# Patient Record
Sex: Female | Born: 1960 | Race: Black or African American | Hispanic: No | Marital: Married | Smoking: Never smoker
Health system: Southern US, Community
[De-identification: ages and names within clinical notes are randomized; demographics above are authoritative.]

## PROBLEM LIST (undated history)

## (undated) DIAGNOSIS — R42 Dizziness and giddiness: Secondary | ICD-10-CM

## (undated) DIAGNOSIS — E538 Deficiency of other specified B group vitamins: Secondary | ICD-10-CM

## (undated) DIAGNOSIS — K219 Gastro-esophageal reflux disease without esophagitis: Secondary | ICD-10-CM

## (undated) DIAGNOSIS — D649 Anemia, unspecified: Secondary | ICD-10-CM

## (undated) HISTORY — DX: Anemia, unspecified: D64.9

## (undated) HISTORY — DX: Gastro-esophageal reflux disease without esophagitis: K21.9

## (undated) HISTORY — DX: Deficiency of other specified B group vitamins: E53.8

## (undated) HISTORY — PX: OVARIAN CYST REMOVAL: SHX89

## (undated) HISTORY — PX: ABDOMINAL HYSTERECTOMY: SHX81

## (undated) HISTORY — PX: TUBAL LIGATION: SHX77

## (undated) HISTORY — DX: Dizziness and giddiness: R42

---

## 1978-07-13 HISTORY — PX: BREAST EXCISIONAL BIOPSY: SUR124

## 1990-07-13 HISTORY — PX: BREAST EXCISIONAL BIOPSY: SUR124

## 1998-07-25 ENCOUNTER — Ambulatory Visit (HOSPITAL_COMMUNITY): Admission: RE | Admit: 1998-07-25 | Discharge: 1998-07-25 | Payer: Self-pay | Admitting: Obstetrics and Gynecology

## 1998-12-25 ENCOUNTER — Other Ambulatory Visit: Admission: RE | Admit: 1998-12-25 | Discharge: 1998-12-25 | Payer: Self-pay | Admitting: Obstetrics and Gynecology

## 1999-01-07 ENCOUNTER — Ambulatory Visit (HOSPITAL_COMMUNITY): Admission: RE | Admit: 1999-01-07 | Discharge: 1999-01-07 | Payer: Self-pay | Admitting: Obstetrics and Gynecology

## 1999-01-07 ENCOUNTER — Encounter: Payer: Self-pay | Admitting: Obstetrics and Gynecology

## 1999-06-10 ENCOUNTER — Encounter (INDEPENDENT_AMBULATORY_CARE_PROVIDER_SITE_OTHER): Payer: Self-pay | Admitting: Specialist

## 1999-06-10 ENCOUNTER — Other Ambulatory Visit: Admission: RE | Admit: 1999-06-10 | Discharge: 1999-06-10 | Payer: Self-pay | Admitting: Obstetrics and Gynecology

## 1999-10-03 ENCOUNTER — Encounter (INDEPENDENT_AMBULATORY_CARE_PROVIDER_SITE_OTHER): Payer: Self-pay

## 1999-10-03 ENCOUNTER — Observation Stay (HOSPITAL_COMMUNITY): Admission: RE | Admit: 1999-10-03 | Discharge: 1999-10-04 | Payer: Self-pay | Admitting: Obstetrics and Gynecology

## 2000-08-10 ENCOUNTER — Other Ambulatory Visit: Admission: RE | Admit: 2000-08-10 | Discharge: 2000-08-10 | Payer: Self-pay | Admitting: Otolaryngology

## 2001-01-06 ENCOUNTER — Ambulatory Visit (HOSPITAL_COMMUNITY): Admission: RE | Admit: 2001-01-06 | Discharge: 2001-01-06 | Payer: Self-pay | Admitting: Obstetrics and Gynecology

## 2001-01-06 ENCOUNTER — Encounter: Payer: Self-pay | Admitting: Obstetrics and Gynecology

## 2001-03-22 ENCOUNTER — Other Ambulatory Visit: Admission: RE | Admit: 2001-03-22 | Discharge: 2001-03-22 | Payer: Self-pay | Admitting: Obstetrics and Gynecology

## 2001-09-13 ENCOUNTER — Emergency Department (HOSPITAL_COMMUNITY): Admission: EM | Admit: 2001-09-13 | Discharge: 2001-09-14 | Payer: Self-pay | Admitting: Emergency Medicine

## 2001-11-17 ENCOUNTER — Encounter (INDEPENDENT_AMBULATORY_CARE_PROVIDER_SITE_OTHER): Payer: Self-pay | Admitting: Specialist

## 2001-11-17 ENCOUNTER — Ambulatory Visit (HOSPITAL_COMMUNITY): Admission: RE | Admit: 2001-11-17 | Discharge: 2001-11-17 | Payer: Self-pay | Admitting: *Deleted

## 2002-01-25 ENCOUNTER — Ambulatory Visit (HOSPITAL_COMMUNITY): Admission: RE | Admit: 2002-01-25 | Discharge: 2002-01-25 | Payer: Self-pay | Admitting: Obstetrics and Gynecology

## 2002-01-25 ENCOUNTER — Encounter (INDEPENDENT_AMBULATORY_CARE_PROVIDER_SITE_OTHER): Payer: Self-pay | Admitting: *Deleted

## 2002-01-25 ENCOUNTER — Observation Stay (HOSPITAL_COMMUNITY): Admission: AD | Admit: 2002-01-25 | Discharge: 2002-01-26 | Payer: Self-pay | Admitting: Obstetrics and Gynecology

## 2002-02-14 ENCOUNTER — Ambulatory Visit (HOSPITAL_COMMUNITY): Admission: RE | Admit: 2002-02-14 | Discharge: 2002-02-14 | Payer: Self-pay | Admitting: Obstetrics and Gynecology

## 2002-02-14 ENCOUNTER — Encounter: Payer: Self-pay | Admitting: Obstetrics and Gynecology

## 2002-02-15 ENCOUNTER — Encounter: Payer: Self-pay | Admitting: Obstetrics and Gynecology

## 2002-02-15 ENCOUNTER — Ambulatory Visit (HOSPITAL_COMMUNITY): Admission: RE | Admit: 2002-02-15 | Discharge: 2002-02-15 | Payer: Self-pay | Admitting: Obstetrics and Gynecology

## 2002-02-21 ENCOUNTER — Ambulatory Visit (HOSPITAL_COMMUNITY): Admission: RE | Admit: 2002-02-21 | Discharge: 2002-02-21 | Payer: Self-pay | Admitting: Obstetrics and Gynecology

## 2002-02-21 ENCOUNTER — Encounter: Payer: Self-pay | Admitting: Obstetrics and Gynecology

## 2003-08-02 ENCOUNTER — Other Ambulatory Visit: Admission: RE | Admit: 2003-08-02 | Discharge: 2003-08-02 | Payer: Self-pay | Admitting: Obstetrics and Gynecology

## 2003-08-07 ENCOUNTER — Ambulatory Visit (HOSPITAL_COMMUNITY): Admission: RE | Admit: 2003-08-07 | Discharge: 2003-08-07 | Payer: Self-pay | Admitting: Obstetrics and Gynecology

## 2004-06-02 ENCOUNTER — Inpatient Hospital Stay (HOSPITAL_COMMUNITY): Admission: EM | Admit: 2004-06-02 | Discharge: 2004-06-04 | Payer: Self-pay | Admitting: Emergency Medicine

## 2004-06-02 ENCOUNTER — Ambulatory Visit: Payer: Self-pay | Admitting: Cardiology

## 2004-06-03 ENCOUNTER — Ambulatory Visit: Payer: Self-pay | Admitting: Cardiology

## 2004-06-26 ENCOUNTER — Ambulatory Visit: Payer: Self-pay

## 2004-10-02 ENCOUNTER — Other Ambulatory Visit: Admission: RE | Admit: 2004-10-02 | Discharge: 2004-10-02 | Payer: Self-pay | Admitting: Obstetrics and Gynecology

## 2004-10-07 ENCOUNTER — Ambulatory Visit (HOSPITAL_COMMUNITY): Admission: RE | Admit: 2004-10-07 | Discharge: 2004-10-07 | Payer: Self-pay | Admitting: Obstetrics and Gynecology

## 2005-07-21 ENCOUNTER — Emergency Department (HOSPITAL_COMMUNITY): Admission: EM | Admit: 2005-07-21 | Discharge: 2005-07-21 | Payer: Self-pay | Admitting: Emergency Medicine

## 2005-07-22 ENCOUNTER — Emergency Department (HOSPITAL_COMMUNITY): Admission: EM | Admit: 2005-07-22 | Discharge: 2005-07-22 | Payer: Self-pay | Admitting: Emergency Medicine

## 2005-10-07 ENCOUNTER — Other Ambulatory Visit: Admission: RE | Admit: 2005-10-07 | Discharge: 2005-10-07 | Payer: Self-pay | Admitting: Obstetrics and Gynecology

## 2005-10-13 ENCOUNTER — Ambulatory Visit (HOSPITAL_COMMUNITY): Admission: RE | Admit: 2005-10-13 | Discharge: 2005-10-13 | Payer: Self-pay | Admitting: Obstetrics and Gynecology

## 2007-06-24 ENCOUNTER — Ambulatory Visit: Payer: Self-pay | Admitting: Internal Medicine

## 2007-07-11 LAB — CBC WITH DIFFERENTIAL/PLATELET
BASO%: 0.4 % (ref 0.0–2.0)
Basophils Absolute: 0 10*3/uL (ref 0.0–0.1)
EOS%: 0.7 % (ref 0.0–7.0)
Eosinophils Absolute: 0 10*3/uL (ref 0.0–0.5)
HCT: 42.4 % (ref 34.8–46.6)
HGB: 14.6 g/dL (ref 11.6–15.9)
LYMPH%: 22.7 % (ref 14.0–48.0)
MCH: 30.8 pg (ref 26.0–34.0)
MCHC: 34.3 g/dL (ref 32.0–36.0)
MCV: 89.8 fL (ref 81.0–101.0)
MONO#: 0.2 10*3/uL (ref 0.1–0.9)
MONO%: 2.9 % (ref 0.0–13.0)
NEUT#: 4.4 10*3/uL (ref 1.5–6.5)
NEUT%: 73.3 % (ref 39.6–76.8)
Platelets: 267 10*3/uL (ref 145–400)
RBC: 4.72 10*6/uL (ref 3.70–5.32)
RDW: 12 % (ref 11.3–14.5)
WBC: 6.1 10*3/uL (ref 3.9–10.0)
lymph#: 1.4 10*3/uL (ref 0.9–3.3)

## 2007-07-11 LAB — COMPREHENSIVE METABOLIC PANEL
ALT: 12 U/L (ref 0–35)
AST: 19 U/L (ref 0–37)
Albumin: 4.3 g/dL (ref 3.5–5.2)
Alkaline Phosphatase: 72 U/L (ref 39–117)
BUN: 13 mg/dL (ref 6–23)
CO2: 25 mEq/L (ref 19–32)
Calcium: 9.4 mg/dL (ref 8.4–10.5)
Chloride: 104 mEq/L (ref 96–112)
Creatinine, Ser: 0.89 mg/dL (ref 0.40–1.20)
Glucose, Bld: 85 mg/dL (ref 70–99)
Potassium: 4 mEq/L (ref 3.5–5.3)
Sodium: 142 mEq/L (ref 135–145)
Total Bilirubin: 0.6 mg/dL (ref 0.3–1.2)
Total Protein: 7.9 g/dL (ref 6.0–8.3)

## 2007-07-11 LAB — IRON AND TIBC
%SAT: 43 % (ref 20–55)
Iron: 136 ug/dL (ref 42–145)
TIBC: 313 ug/dL (ref 250–470)
UIBC: 177 ug/dL

## 2007-07-11 LAB — VITAMIN B12: Vitamin B-12: 429 pg/mL (ref 211–911)

## 2007-07-11 LAB — FERRITIN: Ferritin: 26 ng/mL (ref 10–291)

## 2007-07-11 LAB — LACTATE DEHYDROGENASE: LDH: 172 U/L (ref 94–250)

## 2007-07-11 LAB — FOLATE: Folate: >20 ng/mL

## 2008-12-12 ENCOUNTER — Encounter: Admission: RE | Admit: 2008-12-12 | Discharge: 2008-12-12 | Payer: Self-pay | Admitting: Obstetrics and Gynecology

## 2010-02-17 ENCOUNTER — Ambulatory Visit (HOSPITAL_COMMUNITY): Admission: RE | Admit: 2010-02-17 | Discharge: 2010-02-17 | Payer: Self-pay | Admitting: Obstetrics and Gynecology

## 2010-11-28 NOTE — Op Note (Signed)
[facility]  Patient:    Katie Hendrix, Katie Hendrix                       MRN: [id number] Proc. Date: 10/03/99 Adm. Date:  [id number] Attending:  Leonard Schwartz                           Operative Report  PREOPERATIVE DIAGNOSIS:       Fibroid uterus.  Dysmenorrhea.  Menorrhagia. Anemia (hemoglobin is 10.8).  POSTOPERATIVE DIAGNOSIS:      Fibroid uterus.  Dysmenorrhea.  Menorrhagia. Anemia (hemoglobin is 10.8).  OPERATION:                    Vaginal hysterectomy.  SURGEON:                      Janine Limbo, M.D.  ASSISTANT:                    Henreitta Leber, P.A.  ANESTHESIA:                   General anesthesia.  ESTIMATED BLOOD LOSS:  INDICATIONS:                  [name] is a 50 year old female, para 2-0-0-2, who presents with the above mentioned diagnosis.  The patient understands the indications for her procedure and she accepts the risks of, but not limited to,  anesthetic complications, bleeding, infections, and possible damage to the surrounding organs.  FINDINGS:                     The patient had a 12 to 14 week size irregular fibroid uterus.  There were simple cysts present on both ovaries.  The fallopian tubes appeared normal.  DESCRIPTION OF PROCEDURE:     The patient was taken to the operating room where a general anesthesia was given.  The patients abdomen, perineum, and vagina were prepped with multiple layers of Betadine.  A Foley catheter was placed in the bladder.  The patient was sterilely draped.  A weighted speculum was placed in he posterior vagina.  The cervix was injected with a diluted solution of Pitressin and normal saline.  A circumferential incision was made around the cervix and the vaginal mucosa was advanced both anteriorly and posteriorly.  Alternating from right to left, the uterosacral ligaments, paracervical tissues, parametrial tissues, uterine arteries, and upper pedicles were clamped,  cut, sutured, and tied securely.  The uterus was inverted through the posterior colpotomy.  The uterus was transected from the remaining upper tissues.  The specimen was sent to pathology. The upper pedicles were secured using free ties and suture ligatures.  The figure-of-eight sutures were required for hemostasis.  The sutures attached to he uterosacral ligaments were brought out through the vaginal angle and then tied securely.  A McCall culdoplasty was placed in the posterior cul-de-sac incorporating the uterosacral ligaments and the posterior peritoneum.  A final check was made for hemostasis and again hemostasis was confirmed.  All instruments were removed.  The vaginal cuff was closed using figure-of-eight sutures incorporating the anterior vaginal mucosa, the anterior peritoneum, the posterior peritoneum, and the posterior vaginal mucosa.  Hemostasis was adequate.  The McCall culdoplasty suture was tied and the apex of the vagina was noted to elevate into the midpelvis.  0 Vicryl was  the suture material used throughout the procedure.  Sponge, needle, and instrument counts were correct x 2 occasions.  The estimated blood loss was 100 cc.  The patient tolerated her procedure well.  She was noted to drain clear yellow urine at the end of her procedure.  She was awakened from her anesthetic and taken to the recovery room in stable condition. DD:  10/03/99 TD:  10/03/99 Job: [id number]

## 2010-11-28 NOTE — H&P (Signed)
NAME:  Katie Hendrix, Katie Hendrix NO.:  [id number]   MEDICAL RECORD NO.:  [id number]          PATIENT TYPE:  EMS   LOCATION:  MAJO                         FACILITY:  [facility]   PHYSICIAN:  Jonelle Sidle, M.D. LHCDATE OF BIRTH:  1961-04-03   DATE OF ADMISSION:  06/02/2004  DATE OF DISCHARGE:                                HISTORY & PHYSICAL   GYNECOLOGIST:  Janine Limbo, M.D.   GASTROENTEROLOGIST:  Althea Grimmer. Luther Parody, M.D.   CHIEF COMPLAINT:  Chest discomfort.   HISTORY OF PRESENT ILLNESS:  Ms. Katie Hendrix is a very pleasant 50 year old woman  with no history of type 2 diabetes mellitus, hypertension, dyslipidemia or  coronary artery disease.  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  She has been jogging and has noted over the last  few weeks at the end of her run that she has had a tightness in her chest  that typically last for 10-15 minutes.  This was fairly mild and did not  seem to bother her very much, although she has had progressive symptoms.  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  She did note that the symptoms were worse after  eating lunch today, but reports they were somewhat better after being placed  on nitroglycerin paste in the emergency room.  She does not describe  specific chest tightness while she was running more at the end of the run  and today it was actually mostly at rest.  She has not had any prior cardiac  evaluation, except that she states of a chest pain evaluation approximately  three years ago, although I am not certain that any stress testing or other  diagnostic testing was actually performed at that time based on available  information.  At present, her electrocardiogram shows sinus rhythm and she  has T-wave inversions anteriorly that are new compared to previous tracing  in March of 2003.  A repeat tracing is pending at this time.  Initial point  of care cardiac markers are normal.   ALLERGIES:  No known drug allergies.   CURRENT MEDICATIONS:  B12 injections monthly.   PAST MEDICAL HISTORY:  1.  Pernicious anemia on B12 supplementation.  2.  Status post left breast biopsy.  3.  Status post ovarian cyst removal on two occasions.  4.  Status post cesarean section on two occasions.   No documented history of coronary artery disease, myocardial infarction,  dyslipidemia, type 2 diabetes mellitus, hypertension or thyroid disease.   SOCIAL HISTORY:  The patient is married.  [place]  She has three children.  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.  She denies any tobacco or illicit substance  use.   FAMILY HISTORY:  Significant for coronary artery disease and bypass grafting  in the patient's mother in her 78s.  REVIEW OF SYSTEMS:  As described in the history of present illness.  She has  otherwise had some mild palpitations, but no syncope.   PHYSICAL EXAMINATION:  VITAL SIGNS:  The blood pressure is 103/67, heart  rate 64, temperature 97.3 degrees, respirations 16 and oxygen saturation  100% on room air.  GENERAL APPEARANCE:  This is a well-nourished woman in no acute distress.  HEENT:  Conjunctivae and lids normal.  The oropharynx is clear.  NECK:  Supple without elevated jugular venous pressure or loud carotid  bruits.  No thyromegaly is noted.  LUNGS:  Clear to auscultation bilaterally with no wheezing or labored  breathing at rest.  CARDIAC:  Regular rate and rhythm without pericardial rub, loud murmur or S3  gallop.  ABDOMEN:  Soft and nontender with normoactive bowel sounds.  EXTREMITIES:  No pitting edema.  Peripheral pulses are 2+.  SKIN:  No ulcerative changes are noted.  MUSCULOSKELETAL:  No kyphosis  is noted.  NEUROPSYCHIATRIC:  The patient is alert and oriented x 3.   LABORATORY DATA:  Hemoglobin 13.9, hematocrit 41.  Sodium 140, potassium  4.7, BUN 16, creatinine 1.1, glucose 98.  Initial point of care troponin I  levels and CK-MB levels were normal x 2.   Chest x-ray reported as showing no acute disease process.   IMPRESSION:  1.  Chest pain syndrome as outlined in a 50 year old woman with a history of      coronary artery disease in her mother at age 17, but no other major      cardiac risk factors.  Electrocardiogram is abnormal showing anterior T-      wave inversions that are new compared to a tracing from 2003.  Cardiac      markers were negative initially.  2.  Possible history of gastrointestinal reflux.  3.  History of pernicious anemia.   PLAN:  1.  The patient will be admitted to telemetry.  2.  We will continue to cycle cardiac markers.  3.  Will place her on aspirin, Lovenox, nitroglycerin paste and low-dose      beta blocker.  4.  Will also treat with a proton pump inhibitor.  5.  I discussed the situation with the patient and her husband, including      available noninvasive and invasive tests to investigate the possibility      of coronary artery disease.  Obviously cardiac markers will help in this      decision.  However, my feeling is that we ought to consider definitive      testing in this case given the electrocardiographic changes in      particular.  Ms. Brouse was hesitate to consider coronary angiography.  We      will at least tentative schedule an exercise Cardiolite as an inpatient      for tomorrow, assuming her cardiac markers are negative.  Coronary      angiography could be discussed again with the patient, however,      particularly to clarify the situation.  She will think about things      overnight.      SGM/MEDQ  D:  06/02/2004  T:  06/02/2004  Job:  [id number]

## 2010-11-28 NOTE — Discharge Summary (Signed)
NAME:  Katie Hendrix, Katie Hendrix NO.:  [id number]   MEDICAL RECORD NO.:  [id number]          PATIENT TYPE:  INP   LOCATION:  4737                         FACILITY:  [facility]   PHYSICIAN:  Jonelle Sidle, M.D. LHCDATE OF BIRTH:  [birth year]   DATE OF ADMISSION:  06/02/2004  DATE OF DISCHARGE:  06/04/2004                                 DISCHARGE SUMMARY   PROCEDURE:  1.  Cardiac catheterization.  2.  Coronary arteriogram.  3.  Left ventriculogram.   HOSPITAL COURSE:  Katie Hendrix is a 50 year old female with no known history of  coronary artery disease.  She had been having episodic chest tightness with  exertion, and on the day of admission, her symptoms worsened.  They had  started during activity.  She took aspirin which increased her chest pain  and received sublingual nitroglycerin which made it better.  She was  evaluated in the emergency room and admitted for further evaluation and  treatment.   Katie Hendrix enzymes were negative for a MI and Cardiolite was performed.  Cardiolite showed an EF of 78%.  There were no wall motion abnormalities.  There was borderline reversibility along the left ventricular interior wall.  Wall motion was normal.  Films were evaluated and it was felt that cardiac  catheterization was indicated to further define her anatomy.  This was  performed on June 04, 2004.   The cardiac catheterization showed normal coronary arteries with an EF of  65% with no MR.  It was felt that the cause of her chest pain was not  cardiac.  She was started on a proton pump inhibitor.  Her best rest is  pending completion but her vital signs are stable and her groin is without  oozing or hematoma.  If she remains pain-free and has no problems with her  catheter site, she is tentatively scheduled for discharge on June 04, 2004 without patient follow-up arranged.   DISCHARGE DIAGNOSES:  1.  Chest pain:  Normal coronary arteries by catheterization this  admission.  2.  History of pernicious anemia on B12 supplementation.  3.  Mild hyperlipidemia with a total cholesterol of 178, triglycerides 34,      HDL 58, LDL 113.  4.  Status post left breast biopsy, ovarian cyst removal x 2, and cesarean      section x 2.   DISCHARGE INSTRUCTIONS:  1.  Her activity level is to include no driving or strenuous activity for      two days.  2.  She is to call the office for any problems with the catheter site.  3.  She is to stick to a low fat diet.  4.  She is to follow up with a P.A. for Dr. Charlton Haws on June 19, 2004      and she is to call Dr. Althea Grimmer. Santogade for an appointment.   DISCHARGE MEDICATIONS:  1.  B12 injections as prior to admission.  2.  Protonix 40 mg q.d.       RB/MEDQ  D:  06/04/2004  T:  06/04/2004  Job:  [id number]   cc:   Charlton Haws, M.D.   Althea Grimmer. Luther Parody, M.D.  [address]  [place]  [place] [address]  Fax: [phone]   Janine Limbo, M.D.  [address], [address]  [place]  [place] [address]  Fax: [phone]

## 2010-11-28 NOTE — Cardiovascular Report (Signed)
NAME:  Katie Hendrix, Katie Hendrix NO.:  [id number]   MEDICAL RECORD NO.:  [id number]          PATIENT TYPE:  INP   LOCATION:  4737                         FACILITY:  [facility]   PHYSICIAN:  Carole Binning, M.D. LHCDATE OF BIRTH:  Mar 08, 1961   DATE OF PROCEDURE:  06/04/2004  DATE OF DISCHARGE:                              CARDIAC CATHETERIZATION   PROCEDURE PERFORMED:  Left heart catheterization with coronary angiography  and left ventriculography.   INDICATION:  [name] is a 50 year old woman who presented to the emergency  room two days ago with substernal chest pain.  EKG showed mild T-wave  inversions in the anterior precordial leads.  She ruled out for myocardial  infarction.  She then underwent a stress nuclear scan.  This was interpreted  as revealing possible ischemia in the anterior wall.  She was therefore  referred for cardiac catheterization.   CATHETERIZATION PROCEDURAL NOTE:  A 6 French sheath was placed in the right  femoral artery.  Left coronary arteries were imaged with a 6 French JL-3.5  catheter.  The right coronary artery was imaged with a No-Torque right  catheter.  There was mild catheter related spasm of the proximal right  coronary artery which was relieved with intracoronary nitroglycerin.  Left  ventriculography was performed with an angled pigtail catheter.  Contrast  was Omnipaque.  There were no complications.   CATHETERIZATION RESULTS:   HEMODYNAMICS:  1.  Left ventricular pressure 112/8.  2.  Aortic pressure 112/68.  3.  There is no aortic valve gradient.   LEFT VENTRICULOGRAM:  Wall motion is normal.  Ejection fraction estimated at  65%.  There is no mitral regurgitation.   CORONARY ARTERIOGRAPHY:  Left main is normal.   Left anterior descending artery gives rise to two small diagonal branches.  The LAD is normal.   Left circumflex gives rise to a small ramus intermedius, normal size first  obtuse marginal, normal size second obtuse  marginal branches.  The left  circumflex is normal.   Right coronary artery is a dominant vessel giving rise to a normal size  posterior descending artery and a small posterior lateral branch.  The right  coronary artery is normal.   IMPRESSION:  1.  Normal left ventricular systolic function.  2.  Normal coronary arteries.       MWP/MEDQ  D:  06/04/2004  T:  06/04/2004  Job:  [id number]   cc:   Janine Limbo, M.D.  [address]  [place]  [place] [address]  Fax: [phone]

## 2010-11-28 NOTE — Op Note (Signed)
[facility]  Patient:    Katie Hendrix, Katie Hendrix Visit Number: [id number] MRN: [id number]          Service Type: DSU Location: [facility] Attending Physician:  Leonard Schwartz Dictated by:   Janine Limbo, M.D. Proc. Date: 01/25/02 Admit Date:  01/25/2002 Discharge Date: 01/25/2002                             Operative Report  PREOPERATIVE DIAGNOSES:       Left ovarian dermoid cyst.  POSTOPERATIVE DIAGNOSES:      1. Left ovarian dermoid cyst.                               2. Pelvic adhesive disease.  PROCEDURE:                    1. Diagnostic laparoscopy.                               2. Laparoscopic left ovarian cystectomy.                               3. Laparoscopic lysis of adhesions.  SURGEON:                      Janine Limbo, M.D.  ANESTHESIA:                   General.  DISPOSITION:                  Katie Hendrix is a 50 year old female para 2-0-0-2 who presents with a dermoid cyst in her left ovary as determined by ultrasound.  The patient is status post vaginal hysterectomy.  She is also status post right ovarian dermoid cystectomy.  She understands the indications for her surgical procedure and she accepts the risks of, but not limited to, anesthetic complications, bleeding, infections, and possible damage to the surrounding organs.  She understands that there is a possibility that oophorectomy may be needed.  FINDINGS:                     The uterus was surgically absent.  The right ovary was approximately 1-2 cm in size and was scarred to the right pelvic side wall.  The fallopian tubes were scarred in the midline to the apex of the vagina.  The left ovary was approximately 7 cm in size and there was a 5 cm dermoid cyst within the left ovary.  The left ovary was adhered to the left pelvic side wall.  The left fallopian tube was short and was scarred to the left pelvic side wall.  The large bowel was adhered to the left  broad ligament and there were also adhesions between the bowel and the anterior abdominal wall.  There were adhesions between the left ovary and the posterior cul-de-sac.  The appendix appeared normal.  The liver and the gallbladder appeared normal.  The upper abdomen appeared normal.  PROCEDURE:                    The patient was taken to the operating room where a general anesthetic was given.  The patients abdomen, perineum, and vagina were prepped  with multiple layers of Betadine.  Examination under anesthesia was performed.  A Foley catheter was placed in the bladder.  A padded sponge stick was placed in the vagina.  The patient was sterilely draped.  The subumbilical area was injected with 4 cc of 0.5% Marcaine with epinephrine.  An incision was made and the Veress needle was inserted without difficulty.  Proper placement was confirmed using the saline drop test.  A pneumoperitoneum was then obtained.  The laparoscopic trocar and then the laparoscope were substituted for the Veress needle.  The pelvic structures were visualized.  A 5 mm trocar was placed in the right lower quadrant under direct visualization.  The skin was injected prior to placement with 3 cc of 0.5% Marcaine.  A 10 mm trocar was placed in the left lower quadrant under direct visualization.  Prior to placement 3 cc of 0.5% Marcaine was placed in the skin.  Lysis of adhesions was accomplished using the laparoscopic scissors and the Bovie cautery.  Care was taken not to damage the vital pelvic structures.  We then cauterized the surface of the left ovary.  The laparoscopic scissors were used to make an incision on the capsule of the left ovary and then the dermoid cyst was shelled from the left ovary using a combination of sharp dissection, blunt dissection, and hydrodissection.  There was a small opening in the dermoid cyst but the contents sealed itself and there was only minimal leakage from the cystic structure.   The cyst was then placed in the endo catch bag and the cyst was removed intact from the left lower quadrant.  The pelvis was then vigorously irrigated.  Hemostasis was achieved in the bed of the left ovary using the bipolar cautery.  The bowel was carefully inspected and there was no evidence of trocar damage.  At this point we felt that we were ready to end our procedure.  The pneumoperitoneum was allowed to escape.  Final check was made for hemostasis and hemostasis was adequate.  All instruments were then removed.  The 10 mm trocar sites were closed using deep sutures of 0 Vicryl.  4-0 Vicryl was then used to close the skin and the 5 mm trocar site.  Sponge, needle, and instrument counts were correct on two occasions.  The estimated blood loss was 50 cc.  Pictures were taken of the patients anatomy.  The patient tolerated her procedure well. The patient was awakened from her anesthetic without difficulty.  She was taken to the recovery room in stable condition.  Foley catheter and the sponge stick were removed in the operating room.  FOLLOWUP INSTRUCTIONS:        The patient was given Vicodin one to two tablets q.4h. to be used for pain.  She will return to see Dr. Stefano Gaul in two to three weeks for follow-up examination.  She was given a copy of the postoperative instruction sheet as prepared by the [facility] for patients who have undergone laparoscopy. Dictated by:   Janine Limbo, M.D. Attending Physician:  Leonard Schwartz DD:  01/25/02 TD:  01/30/02 Job: [id number]

## 2010-11-28 NOTE — H&P (Signed)
[facility]  Patient:    Katie Hendrix, Katie Hendrix Visit Number: [id number] MRN: [id number]          Service Type: Attending:  Janine Limbo, M.D. Dictated by:   Janine Limbo, M.D. Adm. Date:  01/25/02                           History and Physical  HISTORY OF PRESENT ILLNESS:   Katie Hendrix is a 50 year old female, para 2-0-0-2, who presents for a diagnostic laparoscopy and laparoscopic left ovarian cystectomy because of a dermoid cyst.  The patient is status post vaginal hysterectomy for fibroids dating to October 03, 1999.  She has had discomfort, and an ultrasound showed a 4.8 x 4.2 cm complex cyst with components consistent with a dermoid.  The patient had a laparoscopic tubal cautery in January 2000.  She also has had a cryosurgical ovarian cystectomy in the past.  OBSTETRIC HISTORY:            The patient has had two cesarean deliveries.  DRUG ALLERGIES:               None known.  PAST MEDICAL HISTORY:         The patient denies hypertension and diabetes. She has been diagnosed with pernicious anemia, and she is receiving B12 shots.  SOCIAL HISTORY:               The patient denies cigarette use, alcohol use, and recreational drug use.  REVIEW OF SYSTEMS:            Please see history of present illness.  FAMILY HISTORY:               The patient has a family history of diabetes, heart disease, stroke, heart attacks, and cancer.  PHYSICAL EXAMINATION:  VITAL SIGNS:                  Weight is 134 pounds.  HEENT:                        Within normal limits.  CHEST:                        Clear.  HEART:                        Regular rate and rhythm.  BREASTS:                      Without masses.  ABDOMEN:                      Nontender.  EXTREMITIES:                  Within normal limits.  NEUROLOGIC:                   Grossly normal.  PELVIC:                       External genitalia is normal.  The vagina is normal.  The  cervix is absent.  The uterus is absent.  Adnexa:  There is no fullness noted in the adnexa.  ASSESSMENT:                   A 4.8 cm left dermoid  cyst.  PLAN:                         The patient will undergo a diagnostic laparoscopy and then either a left ovarian cystectomy or a left oophorectomy. She understands the indications for her procedure, and she accepts the risk of, but not limited to, anesthetic complications, bleeding, infection, and possible damage to the surrounding organs. Dictated by:   Janine Limbo, M.D. Attending:  Janine Limbo, M.D. DD:  01/24/02 TD:  01/24/02 Job: [id number]

## 2010-11-28 NOTE — Discharge Summary (Signed)
[facility]  Patient:    Katie Hendrix, Katie Hendrix                       MRN: [id number] Adm. Date:  [id number] Disc. Date: [id number] Attending:  Leonard Schwartz Dictator:   Henreitta Leber, P.A.                           Discharge Summary  DISCHARGE DIAGNOSES:          1. Fibroid uterus.                               2. Dysmenorrhea.                               3. Menorrhagia.                               4. Anemia.  PROCEDURE:                    On date of admission the patient underwent a total vaginal hysterectomy.  HISTORY OF PRESENT ILLNESS:   This is a 50 year old African-American female, para 2-0-0-2, with a history of symptomatic uterine fibroids presenting for a total vaginal hysterectomy.  Please see dictated history and physical examination for  details.  PHYSICAL EXAMINATION:         VITAL SIGNS: The patients weight is 148 pounds. GENERAL: Within normal limits.  PELVIC: EGBUS is within normal limits.  Vagina s normal.  Cervix is nontender.  Uterus 12 weeks size, irregular, firm, and nontender. Adnexa without any appreciable masses.  RECTOVAGINAL: Confirms.  HOSPITAL COURSE:              On date of admission the patient underwent a total vaginal hysterectomy and tolerated the procedure well.  Her postoperative course was unremarkable.  Postoperative hemoglobin was 9.9 (preop hemoglobin 10.8). On postoperative day #1, the patient was believed to have received maximum benefit of her hospital stay and was ready for discharge to home.  DISCHARGE MEDICATIONS:        1. Vicodin one to two tablets every four hours                                  for pain.                               2. Advil 200 mg three tablets every six hours for                                  four to five days.                               3. Iron 325 mg one tablet three times daily for                                  six weeks.  4. Phenergan 25 mg  one tablet every six hours as                                  needed for nausea.  FOLLOW-UP:                    The patient is to phone [facility] to schedule a six-week postoperative visit with Janine Limbo, M.D.  DISCHARGE INSTRUCTIONS:       The patient was given a copy of [facility] postoperative instruction sheet.  She was further advised to abstain from driving for two weeks, heavy lifting for four weeks, and intercourse for six weeks.  The patient was also told to call the office should she have any severe problems or develop a temperature greater than 101 degrees Fahrenheit orally.  Final pathology of the uterus:  Cervix with no pathologic abnormalities, benign proliferative endometrium, leiomyomata uteri (submucosal,  intramural, and subserosal). DD:  11/05/99 TD:  11/05/99 Job: [id number]

## 2010-11-28 NOTE — H&P (Signed)
[facility]  Patient:    Katie Hendrix, Katie Hendrix Visit Number: [id number] MRN: [id number]          Service Type: DSU Location: [facility] Attending Physician:  Leonard Schwartz Dictated by:   Janine Limbo, M.D. Admit Date:  01/25/2002                           History and Physical  HISTORY OF PRESENT ILLNESS:   The patient is a 50 year old female, para 2-0-0-2, who today underwent a diagnostic laparoscopy with laparoscopic left ovarian cystectomy for removal of a dermoid and laparoscopic lysis of adhesions.  The patient noted blood-tinged fluid draining from her umbilical incision while she was in the recovery room.  The drainage has persisted and it was quite heavy.  The patient presented for reevaluation tonight.  The patients procedure was uncomplicated.  Operative findings included adhesions between the bowel and the anterior abdominal wall and the bowel and the left pelvic sidewall.  There was a 5-cm dermoid cyst present in the left ovary that was removed without difficulty.  Hemostasis was adequate at the end of our procedure.  The patient reports that she does not feel bad, but is just concerned about the drainage from her incision.  PAST GYNECOLOGIC HISTORY:     Significant for a prior vaginal hysterectomy for fibroids.  She has also had a tubal ligation.  She had a dermoid cyst removed from her right ovary many years ago.  She did say that she has had some nausea and vomiting since her procedure.  OBSTETRICAL HISTORY:          The patient has had two cesarean sections in the past.  DRUG ALLERGIES:               None known.  PAST MEDICAL HISTORY:         The patient denies hypertension and diabetes. She has a history of pernicious anemia and she is receiving B12 shots.  SOCIAL HISTORY:               The patient denies cigarette use, alcohol use and recreational drug use.  REVIEW OF SYSTEMS:            See history of present  illness.  FAMILY HISTORY:               Noncontributory.  PHYSICAL EXAMINATION:  VITAL SIGNS:                  The patient is afebrile and her vital signs are stable.  HEENT:                        Within normal limits.  CHEST:                        Clear.  HEART:                        Regular rate and rhythm.  ABDOMEN:                      Her abdomen is soft and nontender.  There is copious drainage of blood-tinged fluid from the umbilical incision.  The suprapubic incisions appear dry and intact.  There is no evidence of infection at the umbilical incision.  PELVIC EXAM:  Deferred.  LABORATORY VALUES:            Hemoglobin is 9.6 (preoperative 13.4), her WBC count is 12.1 (preoperative 6900), platelet is 203,000 (249,000 preoperative).  ASSESSMENT: 1. Status post diagnostic laparoscopy with laparoscopic lysis of adhesions    and laparoscopic left dermoid cystectomy. 2. Increased drainage from umbilical incision. 3. Anemia.  PLAN:                         We discussed our options for management.  We discussed observation at home, observation in the hospital and returning to the operating room for exploratory surgery.  The patient reports that she actually does not feel bad and at this point would like to observe only.  She would like to stay in the hospital overnight for observation.  We will repeat the patients laboratory tests in the morning.  We will base our therapy on how she is doing in the morning and how her laboratory values are stabilizing. Dictated by:   Janine Limbo, M.D. Attending Physician:  Leonard Schwartz DD:  01/26/02 TD:  01/26/02 Job: [phone] [id number]

## 2013-05-01 ENCOUNTER — Ambulatory Visit (INDEPENDENT_AMBULATORY_CARE_PROVIDER_SITE_OTHER): Payer: BC Managed Care – PPO | Admitting: Family Medicine

## 2013-05-01 VITALS — BP 110/66 | HR 74 | Temp 98.6°F | Resp 16 | Ht 62.0 in | Wt 154.4 lb

## 2013-05-01 DIAGNOSIS — R1013 Epigastric pain: Secondary | ICD-10-CM

## 2013-05-01 DIAGNOSIS — D510 Vitamin B12 deficiency anemia due to intrinsic factor deficiency: Secondary | ICD-10-CM | POA: Insufficient documentation

## 2013-05-01 DIAGNOSIS — R5381 Other malaise: Secondary | ICD-10-CM

## 2013-05-01 DIAGNOSIS — M79602 Pain in left arm: Secondary | ICD-10-CM

## 2013-05-01 DIAGNOSIS — M79609 Pain in unspecified limb: Secondary | ICD-10-CM

## 2013-05-01 LAB — POCT CBC
Granulocyte percent: 66.9 %G (ref 37–80)
HCT, POC: 43.6 % (ref 37.7–47.9)
Hemoglobin: 13.7 g/dL (ref 12.2–16.2)
Lymph, poc: 1.9 (ref 0.6–3.4)
MCH, POC: 30.9 pg (ref 27–31.2)
MCHC: 31.4 g/dL — AB (ref 31.8–35.4)
MCV: 98.2 fL — AB (ref 80–97)
MID (cbc): 0.4 (ref 0–0.9)
MPV: 8.5 fL (ref 0–99.8)
POC Granulocyte: 4.6 (ref 2–6.9)
POC LYMPH PERCENT: 27.7 %L (ref 10–50)
POC MID %: 5.4 %M (ref 0–12)
Platelet Count, POC: 260 10*3/uL (ref 142–424)
RBC: 4.44 M/uL (ref 4.04–5.48)
RDW, POC: 12.9 %
WBC: 6.9 10*3/uL (ref 4.6–10.2)

## 2013-05-01 LAB — GLUCOSE, POCT (MANUAL RESULT ENTRY): POC Glucose: 98 mg/dl (ref 70–99)

## 2013-05-01 NOTE — Patient Instructions (Signed)
I would recommend that you go to an emergency room for further evaluation- [facility] may be a good option  [address] [place], [place] [address] Phone: [phone]  I will be in touch with the rest of your labs.  If you start to have nay other symptoms again PLEASE go to the ER without fail.

## 2013-05-01 NOTE — Progress Notes (Signed)
[facility] [address], [place] [address] [phone]  Date:  05/01/2013   Name:  Katie Hendrix   DOB:  23-Feb-1961   MRN:  [id number]  PCP:  No PCP Per Patient    Chief Complaint: Fatigue, Back Pain and Abdominal Pain   History of Present Illness:  Katie Hendrix is a 52 y.o. very pleasant female patient who presents with the following:  Here today as a new patient.  She has several concerns today but is having a hard time explaining what is bothering her.  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  Insidious onset of pain, worse when she bends over.  She has been taking various OTC pain medications.  She has tried up to 800 mg of ibuprofen at a time.  The pain does not go down her legs- stays in the lower and mid back.  No leg numbness or weakness, no bowel or bladder incontinence.    Today at lunch she ate chicken, a rib, pie and soda.  She then noted numbness and weakness in her left arm; onset about 3 hours ago.  The arm was uncomfortable but not painful.  Her fingers are tingling.  This has happened to her in the past apparently but she is not able to tell me how often, when this last occurred or how many times she has had this.    However, "the main reason I came in is that I didn't feel good."  She cannot tell me specifically what is wrong.  "I'm just talking about my overall health I guess."  Started to cry.  States that she feels tired and emotional.    She does not have a PCP- she does see Dr. Stefano Gaul for [facility] care and is treated with B12 injections per [facility] for pernicious anemia.  Besides this she is not aware of any chronic health problems.   Her husband dropped her off but then went to the store.   S/p hysterectomy.   There are no active problems to display for this patient.   Past Medical History  Diagnosis Date  .  Anemia     Past Surgical History  Procedure Laterality Date  . Cesarean section    . Abdominal hysterectomy    . Tubal ligation    . Ovarian cyst removal      History  Substance Use Topics  . Smoking status: Never Smoker   . Smokeless tobacco: Not on file  . Alcohol Use: No    Family History  Problem Relation Age of Onset  . Diabetes Mother   . Diabetes Father     No Known Allergies  Medication list has been reviewed and updated.  No current outpatient prescriptions on file prior to visit.   No current facility-administered medications on file prior to visit.    Review of Systems:  As per HPI- otherwise negative.   Physical Examination: Filed Vitals:   05/01/13 1540  BP: 110/66  Pulse: 74  Temp: 98.6 F (37 C)  Resp: 16   Filed Vitals:   05/01/13 1540  Height: 5\' 2"  (1.575 m)  Weight: 154 lb 6.4 oz (70.035 kg)   Body mass index is 28.23 kg/(m^2). Ideal Body Weight: Weight in (lb) to have BMI = 25: 136.4  GEN: WDWN, NAD,  Non-toxic, A & O x 3, appears well.  No visualized neuro defecits HEENT: Atraumatic, Normocephalic. Neck supple. No masses, No LAD.  Bilateral TM wnl, oropharynx normal.  PEERL,EOMI.   Ears and Nose: No external deformity. CV: RRR, No M/G/R. No JVD. No thrill. No extra heart sounds. PULM: CTA B, no wheezes, crackles, rhonchi. No retractions. No resp. distress. No accessory muscle use. ABD: S, NT, ND, +BS. No rebound. No HSM. EXTR: No c/c/e NEURO Normal gait.  Normal romberg.  Normal strength, sensation, DTR and movement of all limbs.  Normal facial movement.  She states that she can feel sensation in both arms equally.   PSYCH: Normally interactive. Conversant. Not depressed or anxious appearing.  Calm demeanor.   Results for orders placed in visit on 05/01/13  POCT CBC      Result Value Range   WBC 6.9  4.6 - 10.2 K/uL   Lymph, poc 1.9  0.6 - 3.4   POC LYMPH PERCENT 27.7  10 - 50 %L   MID (cbc) 0.4  0 - 0.9   POC MID % 5.4  0 -  12 %M   POC Granulocyte 4.6  2 - 6.9   Granulocyte percent 66.9  37 - 80 %G   RBC 4.44  4.04 - 5.48 M/uL   Hemoglobin 13.7  12.2 - 16.2 g/dL   HCT, POC 82.9  56.2 - 47.9 %   MCV 98.2 (*) 80 - 97 fL   MCH, POC 30.9  27 - 31.2 pg   MCHC 31.4 (*) 31.8 - 35.4 g/dL   RDW, POC 13.0     Platelet Count, POC 260  142 - 424 K/uL   MPV 8.5  0 - 99.8 fL  GLUCOSE, POCT (MANUAL RESULT ENTRY)      Result Value Range   POC Glucose 98  70 - 99 mg/dl   EKG: NSR, no ST elevation or depression  Assessment and Plan: Left arm pain - Plan: EKG 12-Lead, Amylase, Lipase  Abdominal pain, epigastric - Plan: EKG 12-Lead  Other malaise and fatigue - Plan: POCT CBC, Comprehensive metabolic panel, TSH, EKG 12-Lead, POCT glucose (manual entry)  Pernicious anemia - Plan: Vitamin B12  Katie Hendrix is here today with sx of back pain, "just not feeling well" and left arm pain and numbness which began about 3 hours prior to exam.  At this time her arm sx are gone or nearly so.  Discussed in detail with her and with her husband who returned to clinic to be with her.   So far our work- up and her exam is reassuring.  However, it is possible that she suffered a TIA or that her left arm pain is due to a cardiac issue.  Recommended that we have her go to the ED for further evaluation, or that at the very least I do a CT of her head as an outpt.  However at this time she is feeling much better and declines any further evaluation.  I will be in touch with her regarding her labs.  If she has any recurrence of her sx in the meantime she will proceed to the nearest ER.    Signed Abbe Amsterdam, MD

## 2013-05-02 ENCOUNTER — Encounter: Payer: Self-pay | Admitting: Family Medicine

## 2013-05-02 LAB — COMPREHENSIVE METABOLIC PANEL
ALT: 14 U/L (ref 0–35)
AST: 19 U/L (ref 0–37)
Albumin: 4.5 g/dL (ref 3.5–5.2)
Alkaline Phosphatase: 72 U/L (ref 39–117)
BUN: 14 mg/dL (ref 6–23)
CO2: 28 mEq/L (ref 19–32)
Calcium: 9.5 mg/dL (ref 8.4–10.5)
Chloride: 104 mEq/L (ref 96–112)
Creat: 1.05 mg/dL (ref 0.50–1.10)
Glucose, Bld: 98 mg/dL (ref 70–99)
Potassium: 4.2 mEq/L (ref 3.5–5.3)
Sodium: 140 mEq/L (ref 135–145)
Total Bilirubin: 0.4 mg/dL (ref 0.3–1.2)
Total Protein: 7.9 g/dL (ref 6.0–8.3)

## 2013-05-02 LAB — LIPASE: Lipase: 22 U/L (ref 0–75)

## 2013-05-02 LAB — AMYLASE: Amylase: 107 U/L — ABNORMAL HIGH (ref 0–105)

## 2013-05-02 LAB — TSH: TSH: 1.045 u[IU]/mL (ref 0.350–4.500)

## 2013-05-02 LAB — VITAMIN B12: Vitamin B-12: 658 pg/mL (ref 211–911)

## 2013-05-03 ENCOUNTER — Telehealth: Payer: Self-pay | Admitting: Family Medicine

## 2013-05-03 DIAGNOSIS — S39012S Strain of muscle, fascia and tendon of lower back, sequela: Secondary | ICD-10-CM

## 2013-05-03 MED ORDER — METHOCARBAMOL 500 MG PO TABS: 500.0000 mg | ORAL_TABLET | Freq: Three times a day (TID) | ORAL | Status: DC

## 2013-05-03 NOTE — Telephone Encounter (Signed)
Called to check on her and go over recent labs.  Labs look ok except her lipase is minimally elevated.  It is at 107; this elevation is so slight that it is almost certainly insignificant.  Her back still hurts her some; will call in some robaxin for her to use as needed.  Caution regarding sedation.  She will let me know if not continuing to do well.   Hand- wrote on her lab letter- please see me in 1 or 2 months for a recheck.

## 2013-05-04 ENCOUNTER — Other Ambulatory Visit: Payer: Self-pay | Admitting: Obstetrics and Gynecology

## 2013-05-04 ENCOUNTER — Other Ambulatory Visit: Payer: Self-pay

## 2013-05-04 DIAGNOSIS — Z1231 Encounter for screening mammogram for malignant neoplasm of breast: Secondary | ICD-10-CM

## 2013-05-19 ENCOUNTER — Ambulatory Visit (HOSPITAL_COMMUNITY)
Admission: RE | Admit: 2013-05-19 | Discharge: 2013-05-19 | Disposition: A | Discharge status: Home / Self Care | Payer: BC Managed Care – PPO | Source: Ambulatory Visit | Attending: Obstetrics and Gynecology | Admitting: Obstetrics and Gynecology

## 2013-05-19 DIAGNOSIS — Z1231 Encounter for screening mammogram for malignant neoplasm of breast: Secondary | ICD-10-CM | POA: Insufficient documentation

## 2013-05-24 ENCOUNTER — Ambulatory Visit: Payer: Self-pay

## 2013-09-19 ENCOUNTER — Ambulatory Visit (INDEPENDENT_AMBULATORY_CARE_PROVIDER_SITE_OTHER): Payer: BC Managed Care – PPO | Admitting: Internal Medicine

## 2013-09-19 VITALS — BP 120/72 | HR 80 | Temp 97.9°F | Resp 16 | Ht 62.0 in | Wt 147.6 lb

## 2013-09-19 DIAGNOSIS — L989 Disorder of the skin and subcutaneous tissue, unspecified: Secondary | ICD-10-CM

## 2013-09-19 DIAGNOSIS — H6123 Impacted cerumen, bilateral: Secondary | ICD-10-CM

## 2013-09-19 DIAGNOSIS — H6120 Impacted cerumen, unspecified ear: Secondary | ICD-10-CM

## 2013-09-19 DIAGNOSIS — H9319 Tinnitus, unspecified ear: Secondary | ICD-10-CM

## 2013-09-19 LAB — POCT SKIN KOH: Skin KOH, POC: NEGATIVE

## 2013-09-19 MED ORDER — MUPIROCIN 2 % EX OINT: TOPICAL_OINTMENT | CUTANEOUS | Status: DC

## 2013-09-19 NOTE — Progress Notes (Signed)
   Subjective:  This chart was scribed for Katie Siaobert Brodee Mauritz, MD, by Katie Hendrix, scribe. The pt's care was started at 4:25 PM.   Patient ID: Katie Hendrix, female    DOB: 05/05/1961, 53 y.o.   MRN: [id number]  Chief Complaint  Patient presents with  . Otalgia    "feels like she's under water", X 2 months  . Tinea    X 4-5 days, been using lamasil cream, right thigh    HPI  Katie Hendrix is a 53 y.o. female who [facility] complaining of a localized skin infection to her right thigh which began a week ago. The pt reports the infection began as one small bump, which itched, and then progressed to multiple bumps that were swollen, red, and minimally tender.  The pt reports she has treated the infection with Lamisil without improvement. She also treated the infection with peroxide. She reports her husband recently had similar symptoms.   She also complains of several months of intermittent right-sided otalgia which she characterizes as feeling like "underwater." She has used peroxide to her right ear as well without relief.  Review of Systems  HENT: Positive for ear pain.   Skin: Positive for color change.      Objective:   Physical Exam  Nursing note and vitals reviewed. Constitutional: She is oriented to person, place, and time. She appears well-developed and well-nourished. No distress.  HENT:  Head: Normocephalic and atraumatic.  Right TM completely occluded by wax. Left TM partially occluded by wax.   Eyes: EOM are normal.  Neck: Neck supple.  Cardiovascular: Normal rate.   Pulmonary/Chest: Effort normal. No respiratory distress.  Musculoskeletal: Normal range of motion.  Neurological: She is alert and oriented to person, place, and time.  Skin: Skin is warm and dry. There is erythema.  Thre is a circumscribed lesion on the right anterior thigh with exfoliating skin. When scraped, there is an underlying 1.5 cm circular maculopapula, erythematous lesion without clearly defining  features.   Psychiatric: She has a normal mood and affect. Her behavior is normal.  KOH scaping =negative  Vitals: BP 120/72  Pulse 80  Temp(Src) 97.9 F (36.6 C) (Oral)  Resp 16  Ht 5\' 2"  (1.575 m)  Wt 147 lb 9.6 oz (66.951 kg)  BMI 26.99 kg/m2  SpO2 99%  Post irrigation, both canals are clear. Tinnitus resolved. Hearing intact. TMs normal.     Assessment & Plan:   4:30 PM- Discussed treatment plan with patient, which includes a culture and cerumen removal, and the patient agreed to the plan.   4:56 PM- Rechecked pt. Pt reports improvement to her hearing following cerumen removal.  Informed pt the skin infection may be symptomatic of a virus.   I have completed the patient encounter in its entirety as documented by the scribe, with editing by me where necessary. Muscab Brenneman P. Merla Richesoolittle, M.D. Skin lesion of right leg - Plan: POCT Skin KOH, Herpes simplex virus culture, CANCELED: Viral culture  Impacted cerumen of both ears -resolved  Tinnitus-resolved  Meds ordered this encounter  Medications  . mupirocin ointment (BACTROBAN) 2 %    Sig: Apply to Skin lesion twice a day for 10 days    Dispense:  22 g    Refill:  0  too late for antivirals to be effective

## 2013-09-21 LAB — HERPES SIMPLEX VIRUS CULTURE: Organism ID, Bacteria: NOT DETECTED

## 2013-11-16 ENCOUNTER — Ambulatory Visit (INDEPENDENT_AMBULATORY_CARE_PROVIDER_SITE_OTHER): Payer: BC Managed Care – PPO | Admitting: Emergency Medicine

## 2013-11-16 VITALS — BP 120/80 | HR 68 | Temp 98.6°F | Resp 16 | Ht 61.25 in | Wt 141.4 lb

## 2013-11-16 DIAGNOSIS — J029 Acute pharyngitis, unspecified: Secondary | ICD-10-CM

## 2013-11-16 DIAGNOSIS — R109 Unspecified abdominal pain: Secondary | ICD-10-CM

## 2013-11-16 DIAGNOSIS — J209 Acute bronchitis, unspecified: Secondary | ICD-10-CM

## 2013-11-16 LAB — POCT UA - MICROSCOPIC ONLY
BACTERIA, U MICROSCOPIC: NEGATIVE
CRYSTALS, UR, HPF, POC: NEGATIVE
Casts, Ur, LPF, POC: NEGATIVE
EPITHELIAL CELLS, URINE PER MICROSCOPY: NEGATIVE
Mucus, UA: NEGATIVE
Yeast, UA: NEGATIVE

## 2013-11-16 LAB — POCT URINALYSIS DIPSTICK
Bilirubin, UA: NEGATIVE
Blood, UA: NEGATIVE
GLUCOSE UA: NEGATIVE
Nitrite, UA: NEGATIVE
Protein, UA: NEGATIVE
SPEC GRAV UA: 1.025
UROBILINOGEN UA: 1
pH, UA: 6

## 2013-11-16 MED ORDER — PROMETHAZINE-CODEINE 6.25-10 MG/5ML PO SYRP: 5.0000 mL | ORAL_SOLUTION | Freq: Four times a day (QID) | ORAL | Status: DC | PRN

## 2013-11-16 MED ORDER — AZITHROMYCIN 250 MG PO TABS: ORAL_TABLET | ORAL | Status: DC

## 2013-11-16 NOTE — Progress Notes (Signed)
[facility] [address], [place] [id number] [phone]  Date:  11/16/2013   Name:  Katie ManchesterLori Hendrix   DOB:  1960/09/04   MRN:  [id number]  PCP:  No PCP Per Patient    Chief Complaint: Cough   History of Present Illness:  Katie Hendrix is a 53 y.o. very pleasant female patient who presents with the following:  Ill for past week or so with nasal congestion and sore throat.  Has some post nasal drainage.  No wheezing or shortness of breath. Some cough not productive.  No nausea or vomiting.  Feels "awful" with malaise and fatigue.   No improvement with over the counter medications or other home remedies. Denies other complaint or health concern today.   Patient Active Problem List   Diagnosis Date Noted  . Pernicious anemia 05/01/2013    Past Medical History  Diagnosis Date  . Anemia     Past Surgical History  Procedure Laterality Date  . Cesarean section    . Abdominal hysterectomy    . Tubal ligation    . Ovarian cyst removal      History  Substance Use Topics  . Smoking status: Never Smoker   . Smokeless tobacco: Not on file  . Alcohol Use: No    Family History  Problem Relation Age of Onset  . Diabetes Mother   . Diabetes Father     No Known Allergies  Medication list has been reviewed and updated.  Current Outpatient Prescriptions on File Prior to Visit  Medication Sig Dispense Refill  . cyanocobalamin 1000 MCG tablet Take 100 mcg by mouth. Take shot once a month.      . methocarbamol (ROBAXIN) 500 MG tablet Take 1 tablet (500 mg total) by mouth 3 (three) times daily. As needed for back pain  20 tablet  0  . mupirocin ointment (BACTROBAN) 2 % Apply to Skin lesion twice a day for 10 days  22 g  0   No current facility-administered medications on file prior to visit.    Review of Systems:  As per HPI, otherwise negative.    Physical Examination: Filed Vitals:   11/16/13 1529  BP: 120/80  Pulse: 68  Temp: 98.6 F (37 C)  Resp: 16    Filed Vitals:   11/16/13 1529  Height: 5' 1.25" (1.556 m)  Weight: 141 lb 6.4 oz (64.139 kg)   Body mass index is 26.49 kg/(m^2). Ideal Body Weight: Weight in (lb) to have BMI = 25: 133.1  GEN: WDWN, NAD, Non-toxic, A & O x 3 HEENT: Atraumatic, Normocephalic. Neck supple. No masses, No LAD.  Oropharynx erythematous.  hoarse Ears and Nose: No external deformity. CV: RRR, No M/G/R. No JVD. No thrill. No extra heart sounds. PULM: CTA B, no wheezes, crackles, rhonchi. No retractions. No resp. distress. No accessory muscle use. ABD: S, NT, ND, +BS. No rebound. No HSM. EXTR: No c/c/e NEURO Normal gait.  PSYCH: Normally interactive. Conversant. Not depressed or anxious appearing.  Calm demeanor.    Assessment and Plan: Bronchitis Pharyngitis zpak Phen c cod  Signed,  Phillips OdorJeffery Kastin Cerda, MD   Results for orders placed in visit on 11/16/13  POCT URINALYSIS DIPSTICK      Result Value Ref Range   Color, UA yellow     Clarity, UA clear     Glucose, UA neg     Bilirubin, UA neg     Ketones, UA trace     Spec Grav,  UA 1.025     Blood, UA neg     pH, UA 6.0     Protein, UA neg     Urobilinogen, UA 1.0     Nitrite, UA neg     Leukocytes, UA Trace    POCT UA - MICROSCOPIC ONLY      Result Value Ref Range   WBC, Ur, HPF, POC 0-2     RBC, urine, microscopic 1-4     Bacteria, U Microscopic neg     Mucus, UA neg     Epithelial cells, urine per micros neg     Crystals, Ur, HPF, POC neg     Casts, Ur, LPF, POC neg     Yeast, UA neg

## 2014-03-26 ENCOUNTER — Other Ambulatory Visit: Payer: Self-pay | Admitting: Gastroenterology

## 2014-03-26 DIAGNOSIS — R1013 Epigastric pain: Secondary | ICD-10-CM

## 2014-04-10 ENCOUNTER — Other Ambulatory Visit: Payer: BC Managed Care – PPO

## 2014-05-08 ENCOUNTER — Other Ambulatory Visit (HOSPITAL_COMMUNITY): Payer: Self-pay | Admitting: Obstetrics and Gynecology

## 2014-05-08 DIAGNOSIS — Z1231 Encounter for screening mammogram for malignant neoplasm of breast: Secondary | ICD-10-CM

## 2014-05-22 ENCOUNTER — Ambulatory Visit (HOSPITAL_COMMUNITY)
Admission: RE | Admit: 2014-05-22 | Discharge: 2014-05-22 | Disposition: A | Discharge status: Home / Self Care | Payer: BC Managed Care – PPO | Source: Ambulatory Visit | Attending: Obstetrics and Gynecology | Admitting: Obstetrics and Gynecology

## 2014-05-22 DIAGNOSIS — R928 Other abnormal and inconclusive findings on diagnostic imaging of breast: Secondary | ICD-10-CM | POA: Diagnosis not present

## 2014-05-22 DIAGNOSIS — Z1231 Encounter for screening mammogram for malignant neoplasm of breast: Secondary | ICD-10-CM | POA: Diagnosis not present

## 2014-05-25 ENCOUNTER — Other Ambulatory Visit: Payer: Self-pay | Admitting: Obstetrics and Gynecology

## 2014-05-25 DIAGNOSIS — R928 Other abnormal and inconclusive findings on diagnostic imaging of breast: Secondary | ICD-10-CM

## 2014-06-12 ENCOUNTER — Ambulatory Visit
Admission: RE | Admit: 2014-06-12 | Discharge: 2014-06-12 | Disposition: A | Discharge status: Home / Self Care | Payer: BC Managed Care – PPO | Source: Ambulatory Visit | Attending: Obstetrics and Gynecology | Admitting: Obstetrics and Gynecology

## 2014-06-12 DIAGNOSIS — R928 Other abnormal and inconclusive findings on diagnostic imaging of breast: Secondary | ICD-10-CM

## 2016-01-16 ENCOUNTER — Other Ambulatory Visit: Payer: Self-pay | Admitting: Obstetrics and Gynecology

## 2016-01-16 DIAGNOSIS — Z1231 Encounter for screening mammogram for malignant neoplasm of breast: Secondary | ICD-10-CM

## 2016-01-20 ENCOUNTER — Ambulatory Visit
Admission: RE | Admit: 2016-01-20 | Discharge: 2016-01-20 | Disposition: A | Discharge status: Home / Self Care | Payer: BC Managed Care – PPO | Source: Ambulatory Visit | Attending: Obstetrics and Gynecology | Admitting: Obstetrics and Gynecology

## 2016-01-20 DIAGNOSIS — Z1231 Encounter for screening mammogram for malignant neoplasm of breast: Secondary | ICD-10-CM

## 2016-12-31 ENCOUNTER — Other Ambulatory Visit: Payer: Self-pay | Admitting: Obstetrics and Gynecology

## 2016-12-31 DIAGNOSIS — Z1231 Encounter for screening mammogram for malignant neoplasm of breast: Secondary | ICD-10-CM

## 2017-01-20 ENCOUNTER — Ambulatory Visit
Admission: RE | Admit: 2017-01-20 | Discharge: 2017-01-20 | Disposition: A | Discharge status: Home / Self Care | Payer: BC Managed Care – PPO | Source: Ambulatory Visit | Attending: Obstetrics and Gynecology | Admitting: Obstetrics and Gynecology

## 2017-01-20 DIAGNOSIS — Z1231 Encounter for screening mammogram for malignant neoplasm of breast: Secondary | ICD-10-CM

## 2017-09-23 ENCOUNTER — Other Ambulatory Visit: Payer: Self-pay | Admitting: Physician Assistant

## 2017-09-23 DIAGNOSIS — Z1231 Encounter for screening mammogram for malignant neoplasm of breast: Secondary | ICD-10-CM

## 2018-01-21 ENCOUNTER — Ambulatory Visit: Payer: BC Managed Care – PPO

## 2018-10-20 DIAGNOSIS — Z90710 Acquired absence of both cervix and uterus: Secondary | ICD-10-CM | POA: Insufficient documentation

## 2018-11-10 ENCOUNTER — Other Ambulatory Visit: Payer: Self-pay | Admitting: Obstetrics and Gynecology

## 2018-11-10 ENCOUNTER — Other Ambulatory Visit: Payer: Self-pay | Admitting: Physician Assistant

## 2018-11-10 ENCOUNTER — Other Ambulatory Visit: Payer: Self-pay | Admitting: *Deleted

## 2018-11-10 DIAGNOSIS — Z1231 Encounter for screening mammogram for malignant neoplasm of breast: Secondary | ICD-10-CM

## 2019-01-05 ENCOUNTER — Ambulatory Visit
Admission: RE | Admit: 2019-01-05 | Discharge: 2019-01-05 | Disposition: A | Discharge status: Home / Self Care | Payer: BC Managed Care – PPO | Source: Ambulatory Visit | Attending: Physician Assistant | Admitting: Physician Assistant

## 2019-01-05 ENCOUNTER — Other Ambulatory Visit: Payer: Self-pay

## 2019-01-05 DIAGNOSIS — Z1231 Encounter for screening mammogram for malignant neoplasm of breast: Secondary | ICD-10-CM

## 2019-02-15 ENCOUNTER — Other Ambulatory Visit: Payer: Self-pay

## 2019-02-15 DIAGNOSIS — Z20822 Contact with and (suspected) exposure to covid-19: Secondary | ICD-10-CM

## 2019-02-16 LAB — NOVEL CORONAVIRUS, NAA: SARS-CoV-2, NAA: NOT DETECTED

## 2019-03-23 ENCOUNTER — Other Ambulatory Visit: Payer: Self-pay

## 2019-03-23 DIAGNOSIS — Z20822 Contact with and (suspected) exposure to covid-19: Secondary | ICD-10-CM

## 2019-03-24 LAB — NOVEL CORONAVIRUS, NAA: SARS-CoV-2, NAA: NOT DETECTED

## 2019-05-29 ENCOUNTER — Other Ambulatory Visit: Payer: Self-pay

## 2019-05-29 DIAGNOSIS — Z20822 Contact with and (suspected) exposure to covid-19: Secondary | ICD-10-CM

## 2019-05-31 LAB — NOVEL CORONAVIRUS, NAA: SARS-CoV-2, NAA: NOT DETECTED

## 2019-09-28 DIAGNOSIS — M7660 Achilles tendinitis, unspecified leg: Secondary | ICD-10-CM | POA: Insufficient documentation

## 2020-03-29 DIAGNOSIS — E7800 Pure hypercholesterolemia, unspecified: Secondary | ICD-10-CM | POA: Insufficient documentation

## 2020-09-20 ENCOUNTER — Encounter: Payer: Self-pay | Admitting: Registered Nurse

## 2020-09-20 ENCOUNTER — Other Ambulatory Visit: Payer: Self-pay

## 2020-09-20 ENCOUNTER — Ambulatory Visit: Payer: BC Managed Care – PPO | Admitting: Registered Nurse

## 2020-09-20 VITALS — BP 119/80 | HR 66 | Temp 98.0°F | Resp 18 | Ht 61.0 in | Wt 150.4 lb

## 2020-09-20 DIAGNOSIS — Z13220 Encounter for screening for lipoid disorders: Secondary | ICD-10-CM

## 2020-09-20 DIAGNOSIS — Z13228 Encounter for screening for other metabolic disorders: Secondary | ICD-10-CM

## 2020-09-20 DIAGNOSIS — Z0001 Encounter for general adult medical examination with abnormal findings: Secondary | ICD-10-CM

## 2020-09-20 DIAGNOSIS — R319 Hematuria, unspecified: Secondary | ICD-10-CM

## 2020-09-20 DIAGNOSIS — Z1329 Encounter for screening for other suspected endocrine disorder: Secondary | ICD-10-CM

## 2020-09-20 DIAGNOSIS — Z130 Encounter for screening for diseases of the blood and blood-forming organs and certain disorders involving the immune mechanism: Secondary | ICD-10-CM

## 2020-09-20 LAB — POCT URINALYSIS DIP (CLINITEK)
Blood, UA: NEGATIVE
Glucose, UA: NEGATIVE mg/dL
Nitrite, UA: NEGATIVE
POC PROTEIN,UA: NEGATIVE
Spec Grav, UA: >=1.03 — AB (ref 1.010–1.025)
Urobilinogen, UA: 1 E.U./dL
pH, UA: 6 (ref 5.0–8.0)

## 2020-09-20 LAB — HEMOGLOBIN A1C: Hgb A1c MFr Bld: 5.4 % (ref 4.8–5.6)

## 2020-09-20 LAB — COMPREHENSIVE METABOLIC PANEL

## 2020-09-20 LAB — CBC WITH DIFFERENTIAL/PLATELET: Neutrophils Absolute: 4.5 10*3/uL (ref 1.4–7.0)

## 2020-09-20 LAB — LIPID PANEL

## 2020-09-20 LAB — TSH

## 2020-09-20 NOTE — Patient Instructions (Signed)
° ° ° °  If you have lab work done today you will be contacted with your lab results within the next 2 weeks.  If you have not heard from us then please contact us. The fastest way to get your results is to register for My Chart. ° ° °IF you received an x-ray today, you will receive an invoice from [facility]. Please contact [facility] at [phone] with questions or concerns regarding your invoice.  ° °IF you received labwork today, you will receive an invoice from [facility]. Please contact [facility] at [phone] with questions or concerns regarding your invoice.  ° °Our billing staff will not be able to assist you with questions regarding bills from these companies. ° °You will be contacted with the lab results as soon as they are available. The fastest way to get your results is to activate your My Chart account. Instructions are located on the last page of this paperwork. If you have not heard from us regarding the results in 2 weeks, please contact this office. °  ° ° ° °

## 2020-09-20 NOTE — Progress Notes (Signed)
Established Patient Office Visit  Subjective:  Patient ID: Katie Hendrix, female    DOB: [birth year]  Age: 61 y.o. MRN: [id number]  CC:  Chief Complaint  Patient presents with  . New Patient (Initial Visit)    Patient states she is here to establish care and a CPE. Patient states in December and they found blood in her urine so she wants it rechecked.    HPI Katie Hendrix presents for visit to est care and CPE  Notes that in Dec she was found to have hematuria at Urgent Care. No acute symptoms. Wants to recheck to ensure it was transient.   Histories reviewed, updated as warranted.  Past Medical History:  Diagnosis Date  . Anemia     Past Surgical History:  Procedure Laterality Date  . ABDOMINAL HYSTERECTOMY    . BREAST EXCISIONAL BIOPSY Left 1992   benign  . BREAST EXCISIONAL BIOPSY Left 1980   benign  . CESAREAN SECTION    . OVARIAN CYST REMOVAL    . TUBAL LIGATION      Family History  Problem Relation Age of Onset  . Diabetes Mother   . Breast cancer Mother   . Diabetes Father     Social History   Socioeconomic History  . Marital status: Married    Spouse name: Not on file  . Number of children: Not on file  . Years of education: Not on file  . Highest education level: Not on file  Occupational History  . Not on file  Tobacco Use  . Smoking status: Never Smoker  . Smokeless tobacco: Never Used  Substance and Sexual Activity  . Alcohol use: No  . Drug use: No  . Sexual activity: Not Currently  Other Topics Concern  . Not on file  Social History Narrative  . Not on file   Social Determinants of Health   Financial Resource Strain: Not on file  Food Insecurity: Not on file  Transportation Needs: Not on file  Physical Activity: Not on file  Stress: Not on file  Social Connections: Not on file  Intimate Partner Violence: Not on file    Outpatient Medications Prior to Visit  Medication Sig Dispense Refill  . cyanocobalamin 1000 MCG tablet Take 100  mcg by mouth. Take shot once a month.    Marland Kitchen azithromycin (ZITHROMAX) 250 MG tablet Take 2 tabs PO x 1 dose, then 1 tab PO QD x 4 days (Patient not taking: Reported on 09/20/2020) 6 tablet 0  . methocarbamol (ROBAXIN) 500 MG tablet Take 1 tablet (500 mg total) by mouth 3 (three) times daily. As needed for back pain (Patient not taking: Reported on 09/20/2020) 20 tablet 0  . mupirocin ointment (BACTROBAN) 2 % Apply to Skin lesion twice a day for 10 days (Patient not taking: Reported on 09/20/2020) 22 g 0  . promethazine-codeine (PHENERGAN WITH CODEINE) 6.25-10 MG/5ML syrup Take 5-10 mLs by mouth every 6 (six) hours as needed. (Patient not taking: Reported on 09/20/2020) 120 mL 0   No facility-administered medications prior to visit.    Allergies  Allergen Reactions  . Penicillins Rash and Swelling    ROS Review of Systems  Constitutional: Negative.   HENT: Negative.   Eyes: Negative.   Respiratory: Negative.   Cardiovascular: Negative.   Gastrointestinal: Negative.   Genitourinary: Negative.   Musculoskeletal: Negative.   Skin: Negative.   Neurological: Negative.   Psychiatric/Behavioral: Negative.   All other systems reviewed and are negative.  Objective:    Physical Exam Vitals and nursing note reviewed.  Constitutional:      General: She is not in acute distress.    Appearance: Normal appearance. She is normal weight. She is not ill-appearing, toxic-appearing or diaphoretic.  HENT:     Head: Normocephalic and atraumatic.     Right Ear: Tympanic membrane, ear canal and external ear normal. There is no impacted cerumen.     Left Ear: Tympanic membrane, ear canal and external ear normal. There is no impacted cerumen.     Nose: Nose normal. No congestion or rhinorrhea.     Mouth/Throat:     Mouth: Mucous membranes are moist.     Pharynx: Oropharynx is clear. No oropharyngeal exudate or posterior oropharyngeal erythema.  Eyes:     General: No scleral icterus.       Right  eye: No discharge.        Left eye: No discharge.     Extraocular Movements: Extraocular movements intact.     Conjunctiva/sclera: Conjunctivae normal.     Pupils: Pupils are equal, round, and reactive to light.  Cardiovascular:     Rate and Rhythm: Normal rate and regular rhythm.     Pulses: Normal pulses.     Heart sounds: Normal heart sounds. No murmur heard. No friction rub. No gallop.   Pulmonary:     Effort: Pulmonary effort is normal. No respiratory distress.     Breath sounds: Normal breath sounds. No stridor. No wheezing, rhonchi or rales.  Chest:     Chest wall: No tenderness.  Abdominal:     General: Abdomen is flat. Bowel sounds are normal. There is no distension.     Palpations: Abdomen is soft. There is no mass.     Tenderness: There is no abdominal tenderness. There is no right CVA tenderness, left CVA tenderness, guarding or rebound.     Hernia: No hernia is present.  Musculoskeletal:        General: No swelling, tenderness, deformity or signs of injury. Normal range of motion.     Right lower leg: No edema.     Left lower leg: No edema.  Skin:    General: Skin is warm and dry.     Capillary Refill: Capillary refill takes less than 2 seconds.     Coloration: Skin is not jaundiced or pale.     Findings: No bruising, erythema, lesion or rash.  Neurological:     General: No focal deficit present.     Mental Status: She is alert and oriented to person, place, and time. Mental status is at baseline.     Cranial Nerves: No cranial nerve deficit.     Sensory: No sensory deficit.     Motor: No weakness.     Coordination: Coordination normal.     Gait: Gait normal.     Deep Tendon Reflexes: Reflexes normal.  Psychiatric:        Mood and Affect: Mood normal.        Behavior: Behavior normal.        Thought Content: Thought content normal.        Judgment: Judgment normal.     BP 119/80   Pulse 66   Temp 98 F (36.7 C) (Temporal)   Resp 18   Ht 5\' 1"  (1.549 m)    Wt 150 lb 6.4 oz (68.2 kg)   SpO2 100%   BMI 28.42 kg/m  Wt Readings from Last 3 Encounters:  09/20/20  150 lb 6.4 oz (68.2 kg)  11/16/13 141 lb 6.4 oz (64.1 kg)  09/19/13 147 lb 9.6 oz (67 kg)     There are no preventive care reminders to display for this patient.  There are no preventive care reminders to display for this patient.  Lab Results  Component Value Date   TSH 1.045 05/01/2013   Lab Results  Component Value Date   WBC 6.9 05/01/2013   HGB 13.7 05/01/2013   HCT 43.6 05/01/2013   MCV 98.2 (A) 05/01/2013   PLT 267 07/11/2007   Lab Results  Component Value Date   NA 140 05/01/2013   K 4.2 05/01/2013   CO2 28 05/01/2013   GLUCOSE 98 05/01/2013   BUN 14 05/01/2013   CREATININE 1.05 05/01/2013   BILITOT 0.4 05/01/2013   ALKPHOS 72 05/01/2013   AST 19 05/01/2013   ALT 14 05/01/2013   PROT 7.9 05/01/2013   ALBUMIN 4.5 05/01/2013   CALCIUM 9.5 05/01/2013   No results found for: CHOL No results found for: HDL No results found for: LDLCALC No results found for: TRIG No results found for: CHOLHDL No results found for: RNHA5B    Assessment & Plan:   Problem List Items Addressed This Visit   None   Visit Diagnoses    Hematuria, unspecified type    -  Primary   Relevant Orders   POCT URINALYSIS DIP (CLINITEK) (Completed)   Screening for endocrine, metabolic and immunity disorder       Lipid screening          No orders of the defined types were placed in this encounter.   Follow-up: No follow-ups on file.   PLAN  Labs collected. Will follow up with the patient as warranted.  Exam unremarkable  POCT UA shows no blood but does show leukocytes, bilirubin, and ketones. Will check bloodwork for abnormalities.  Return annually  Patient encouraged to call clinic with any questions, comments, or concerns.  Janeece Agee, NP

## 2020-09-21 LAB — CBC WITH DIFFERENTIAL/PLATELET
Basophils Absolute: 0 10*3/uL (ref 0.0–0.2)
Basos: 0 %
EOS (ABSOLUTE): 0 10*3/uL (ref 0.0–0.4)
Eos: 0 %
Hematocrit: 41.1 % (ref 34.0–46.6)
Hemoglobin: 13.7 g/dL (ref 11.1–15.9)
Immature Grans (Abs): 0 10*3/uL (ref 0.0–0.1)
Immature Granulocytes: 0 %
Lymphocytes Absolute: 1.3 10*3/uL (ref 0.7–3.1)
Lymphs: 21 %
MCH: 30.6 pg (ref 26.6–33.0)
MCHC: 33.3 g/dL (ref 31.5–35.7)
MCV: 92 fL (ref 79–97)
Monocytes Absolute: 0.4 10*3/uL (ref 0.1–0.9)
Monocytes: 6 %
Neutrophils: 73 %
Platelets: 276 10*3/uL (ref 150–450)
RBC: 4.48 x10E6/uL (ref 3.77–5.28)
RDW: 12.7 % (ref 11.7–15.4)
WBC: 6.3 10*3/uL (ref 3.4–10.8)

## 2020-09-21 LAB — COMPREHENSIVE METABOLIC PANEL
ALT: 11 IU/L (ref 0–32)
AST: 20 IU/L (ref 0–40)
Albumin/Globulin Ratio: 1.4 (ref 1.2–2.2)
BUN: 13 mg/dL (ref 8–27)
Bilirubin Total: 0.5 mg/dL (ref 0.0–1.2)
CO2: 21 mmol/L (ref 20–29)
Creatinine, Ser: 0.93 mg/dL (ref 0.57–1.00)
Globulin, Total: 3.3 g/dL (ref 1.5–4.5)
Potassium: 4.3 mmol/L (ref 3.5–5.2)
Sodium: 140 mmol/L (ref 134–144)
Total Protein: 7.8 g/dL (ref 6.0–8.5)
eGFR: 70 mL/min/{1.73_m2} (ref >59)

## 2020-09-21 LAB — HEMOGLOBIN A1C: Est. average glucose Bld gHb Est-mCnc: 108 mg/dL

## 2020-09-21 LAB — LIPID PANEL
Chol/HDL Ratio: 3.3 ratio (ref 0.0–4.4)
Cholesterol, Total: 203 mg/dL — ABNORMAL HIGH (ref 100–199)
HDL: 61 mg/dL (ref >39)
Triglycerides: 37 mg/dL (ref 0–149)
VLDL Cholesterol Cal: 7 mg/dL (ref 5–40)

## 2021-04-10 IMAGING — MG DIGITAL SCREENING BILATERAL MAMMOGRAM WITH TOMO AND CAD
8 series · 9 of 24 positions shown · non-contrast
Comparison: Previous exam(s).

CLINICAL DATA: Screening.

EXAM:
DIGITAL SCREENING BILATERAL MAMMOGRAM WITH TOMO AND CAD

[L MLO synth-2D]
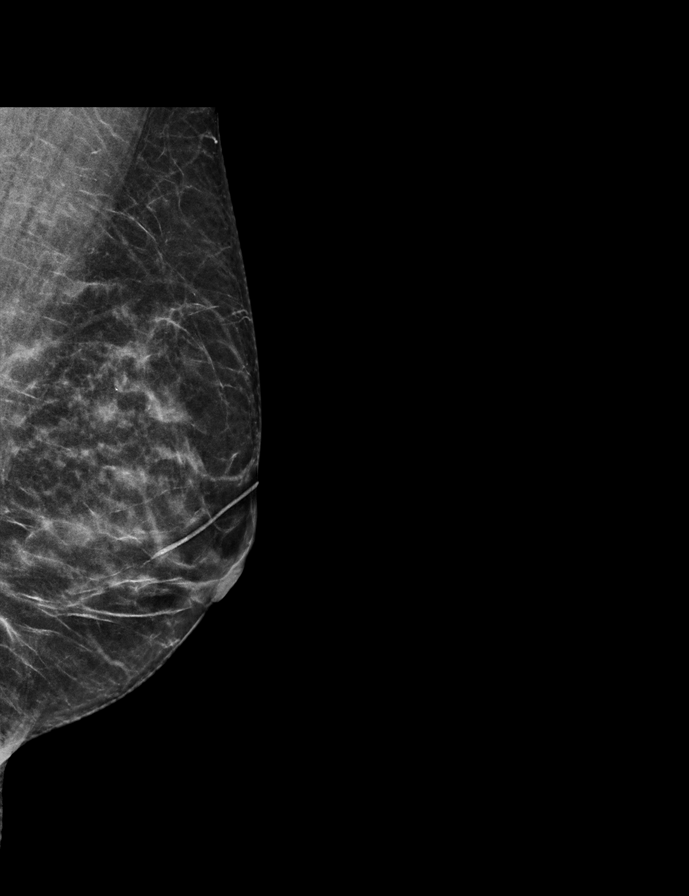

[R MLO synth-2D]
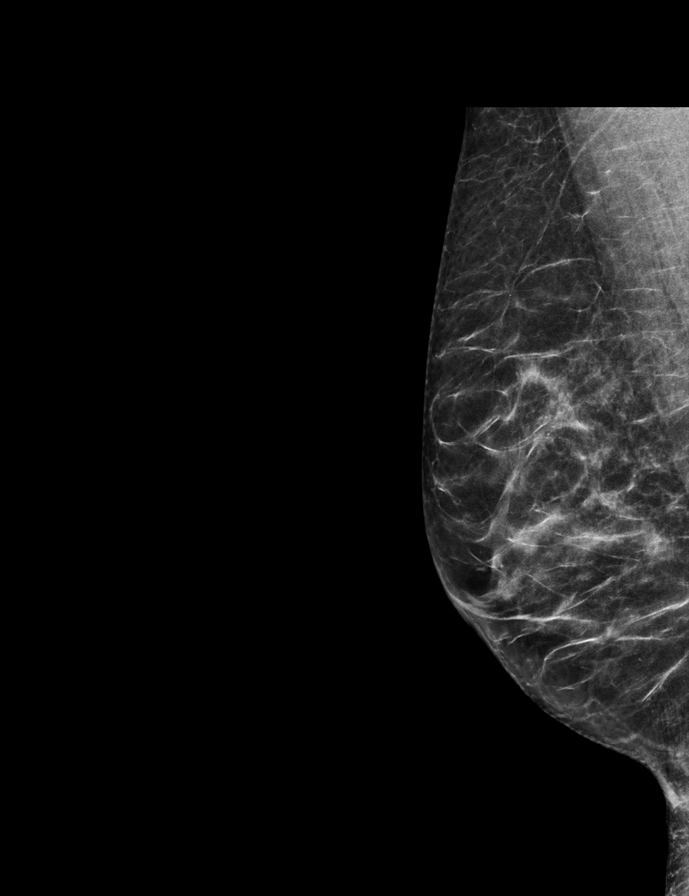

[L CC synth-2D]
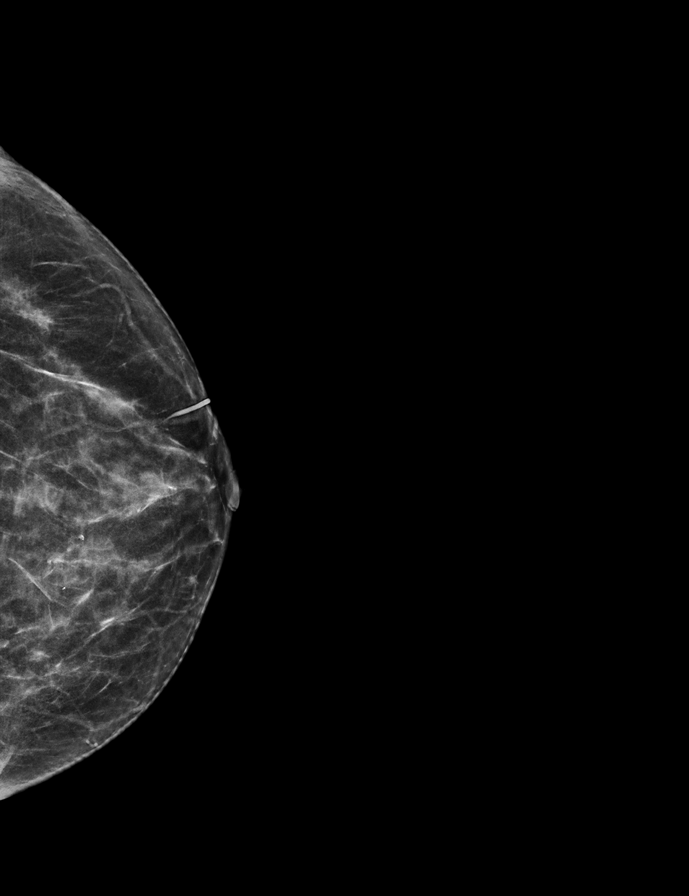

[R CC synth-2D]
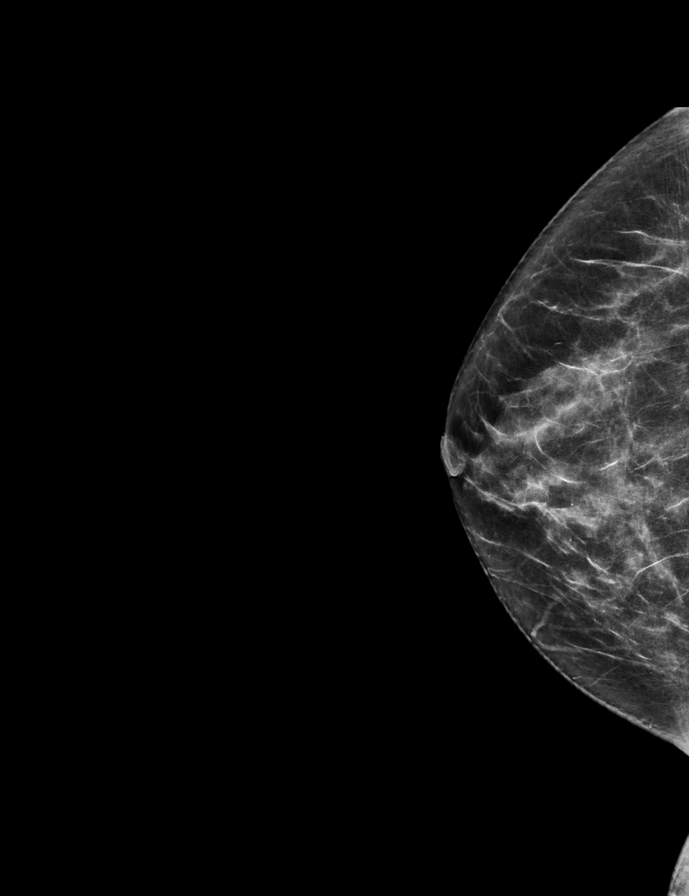

[R CC tomo · 2 of 61 frames shown]
[frame 20/61]
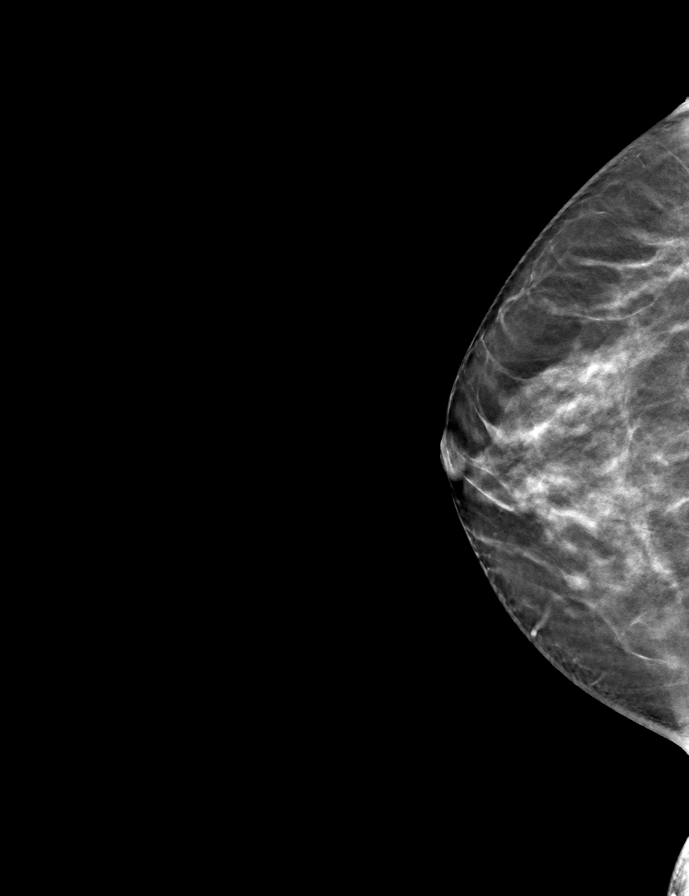
[frame 31/61]
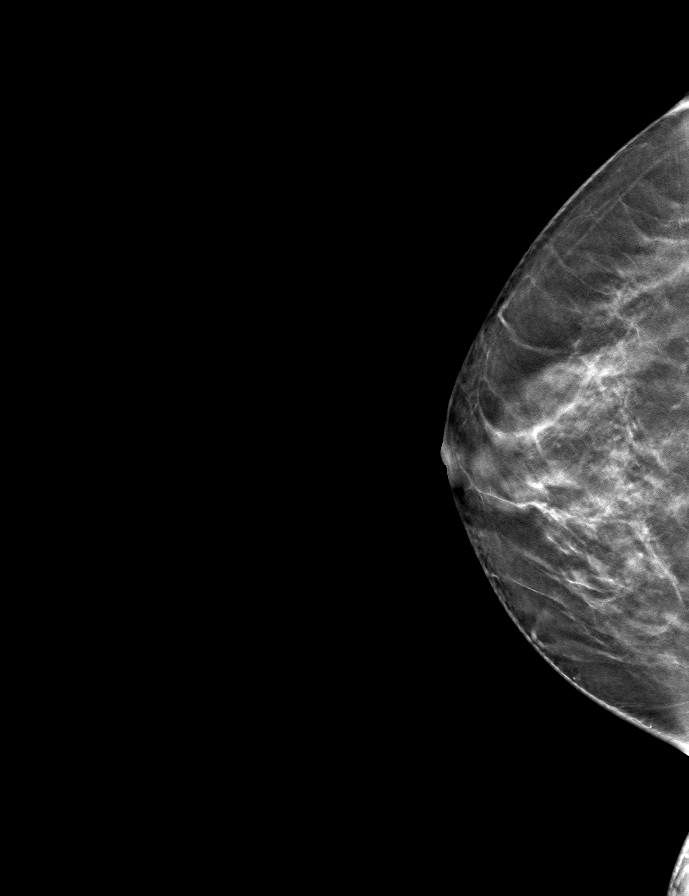

[R MLO tomo · tomo slice 28/55.0]
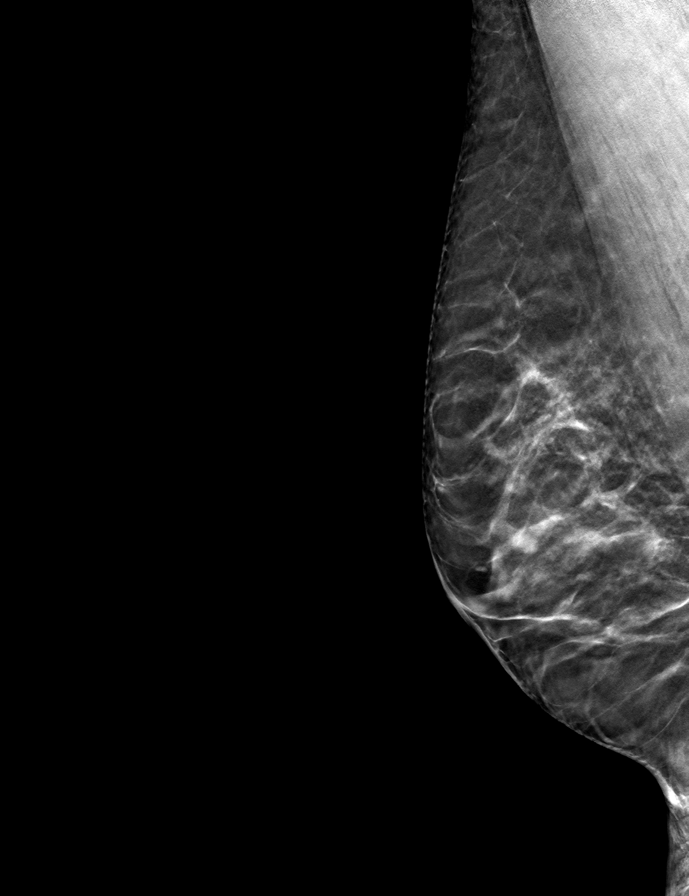

[L MLO tomo · tomo slice 27/54.0]
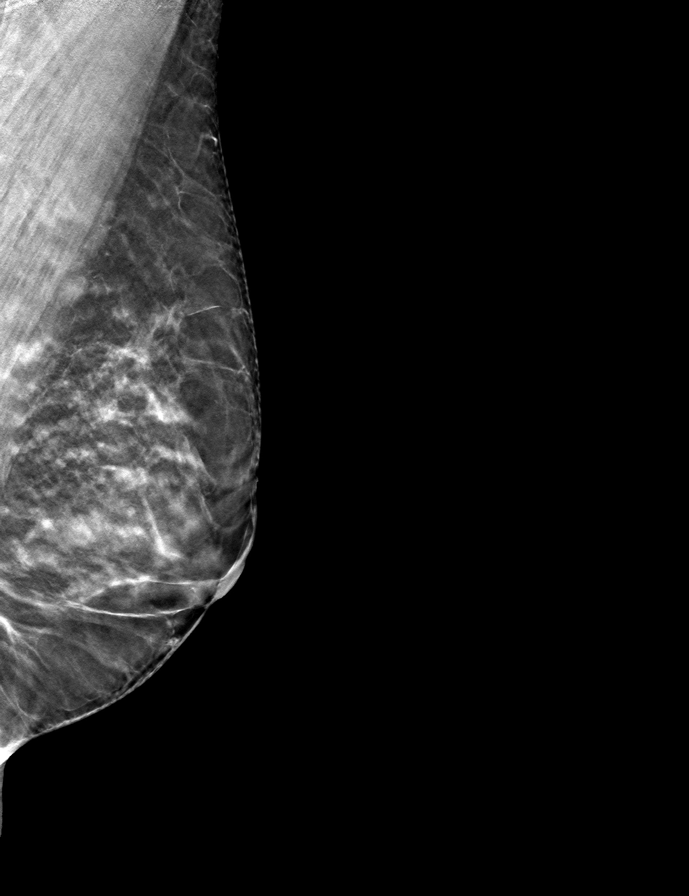

[L CC tomo · tomo slice 29/56.0]
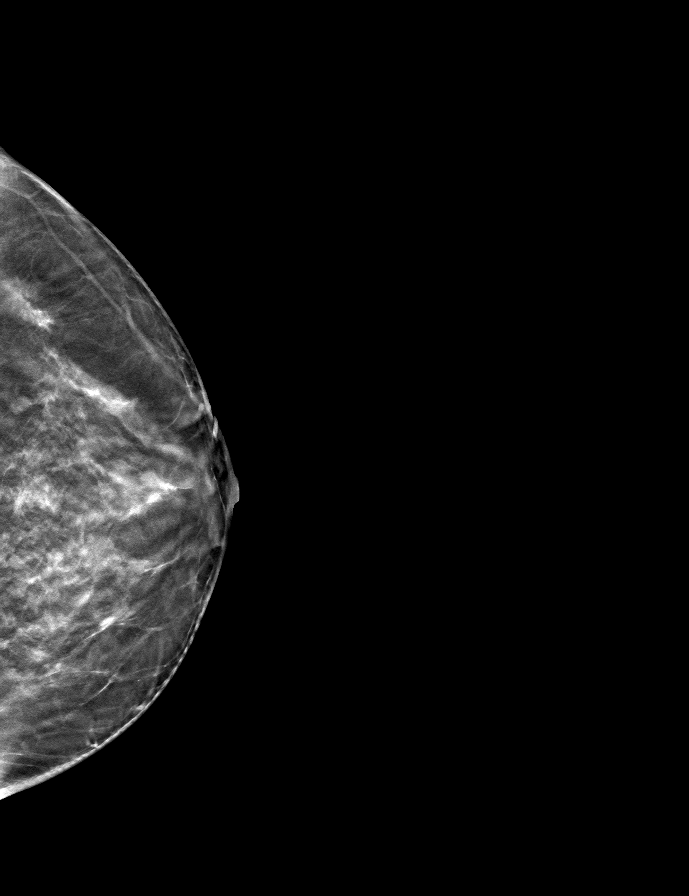

[9 of 24 positions shown; findings below may reference images not displayed]

ACR Breast Density Category c: The breast tissue is heterogeneously
dense, which may obscure small masses.
FINDINGS: There are no findings suspicious for malignancy. Images were
processed with CAD.
IMPRESSION: No mammographic evidence of malignancy. A result letter of this
screening mammogram will be mailed directly to the patient.

RECOMMENDATION:
Screening mammogram in one year. (Code:FT-U-LHB)

BI-RADS CATEGORY  1: Negative.

## 2022-06-02 LAB — HM COLONOSCOPY

## 2022-08-10 ENCOUNTER — Ambulatory Visit
Admission: EM | Admit: 2022-08-10 | Discharge: 2022-08-10 | Discharge status: Against Medical Advice | Payer: BC Managed Care – PPO

## 2022-08-11 ENCOUNTER — Ambulatory Visit
Admission: EM | Admit: 2022-08-11 | Discharge: 2022-08-11 | Disposition: A | Discharge status: Home / Self Care | Payer: BC Managed Care – PPO | Attending: Internal Medicine | Admitting: Internal Medicine

## 2022-08-11 DIAGNOSIS — J069 Acute upper respiratory infection, unspecified: Secondary | ICD-10-CM

## 2022-08-11 MED ORDER — AZITHROMYCIN 250 MG PO TABS: ORAL_TABLET | ORAL | 0 refills | Status: AC

## 2022-08-11 NOTE — ED Triage Notes (Signed)
Pt presents with ongoing sinus congestion and nasal drainage X 1 week with some mild relief with OTC medication.

## 2022-08-11 NOTE — Discharge Instructions (Signed)
I have prescribed an antibiotic to treat upper respiratory infection.  Please follow-up if any symptoms persist or worsen.

## 2022-08-11 NOTE — ED Provider Notes (Signed)
[facility]    CSN: [id number] Arrival date & time: 08/11/22  1017      History   Chief Complaint Chief Complaint  Patient presents with   URI    HPI Katie Hendrix is a 62 y.o. female.   Patient presents with 9-day history of nasal congestion and drainage.  Patient denies any associated cough, sore throat, ear pain, fever, chest pain, shortness of breath, nausea, vomiting, diarrhea, abdominal pain.  Patient has taken Alka-Seltzer cold and flu with minimal improvement in symptoms.  Denies history of asthma or COPD and patient does not smoke cigarettes.  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   URI   Past Medical History:  Diagnosis Date   Anemia     Patient Active Problem List   Diagnosis Date Noted   Pernicious anemia 05/01/2013    Past Surgical History:  Procedure Laterality Date   ABDOMINAL HYSTERECTOMY     BREAST EXCISIONAL BIOPSY Left 1992   benign   BREAST EXCISIONAL BIOPSY Left 1980   benign   CESAREAN SECTION     OVARIAN CYST REMOVAL     TUBAL LIGATION      OB History   No obstetric history on file.      Home Medications    Prior to Admission medications   Medication Sig Start Date End Date Taking? Authorizing Provider  azithromycin (ZITHROMAX Z-PAK) 250 MG tablet Take 2 tablets (500 mg total) by mouth daily for 1 day, THEN 1 tablet (250 mg total) daily for 4 days. 08/11/22 08/16/22 Yes Tianah Lonardo, Michele Rockers, FNP  cyanocobalamin 1000 MCG tablet Take 100 mcg by mouth. Take shot once a month.    [provider]    Family History Family History  Problem Relation Age of Onset   Diabetes Mother    Breast cancer Mother    Diabetes Father     Social History Social History   Tobacco Use   Smoking status: Never   Smokeless tobacco: Never  Vaping Use   Vaping Use: Never used  Substance Use Topics   Alcohol use: No   Drug use: No     Allergies   Penicillins   Review of  Systems Review of Systems Per HPI  Physical Exam Triage Vital Signs ED Triage Vitals [08/11/22 0908]  Enc Vitals Group     BP (!) 156/96     Pulse Rate 72     Resp 17     Temp 98.3 F (36.8 C)     Temp Source Oral     SpO2 98 %     Weight      Height      Head Circumference      Peak Flow      Pain Score 4     Pain Loc      Pain Edu?      Excl. in [place]?    No data found.  Updated Vital Signs BP (!) 156/96 (BP Location: Left Arm)   Pulse 72   Temp 98.3 F (36.8 C) (Oral)   Resp 17   SpO2 98%   Visual Acuity Right Eye Distance:   Left Eye Distance:   Bilateral Distance:    Right Eye Near:   Left Eye Near:    Bilateral Near:     Physical Exam Constitutional:      General: She is not in acute distress.    Appearance: Normal appearance.  She is not toxic-appearing or diaphoretic.  HENT:     Head: Normocephalic and atraumatic.     Right Ear: Tympanic membrane and ear canal normal.     Left Ear: Tympanic membrane and ear canal normal.     Nose: Congestion present.     Mouth/Throat:     Mouth: Mucous membranes are moist.     Pharynx: No posterior oropharyngeal erythema.  Eyes:     Extraocular Movements: Extraocular movements intact.     Conjunctiva/sclera: Conjunctivae normal.     Pupils: Pupils are equal, round, and reactive to light.  Cardiovascular:     Rate and Rhythm: Normal rate and regular rhythm.     Pulses: Normal pulses.     Heart sounds: Normal heart sounds.  Pulmonary:     Effort: Pulmonary effort is normal. No respiratory distress.     Breath sounds: Normal breath sounds. No stridor. No wheezing, rhonchi or rales.  Abdominal:     General: Abdomen is flat. Bowel sounds are normal.     Palpations: Abdomen is soft.  Musculoskeletal:        General: Normal range of motion.     Cervical back: Normal range of motion.  Skin:    General: Skin is warm and dry.  Neurological:     General: No focal deficit present.     Mental Status: She is alert and  oriented to person, place, and time. Mental status is at baseline.  Psychiatric:        Mood and Affect: Mood normal.        Behavior: Behavior normal.      UC Treatments / Results  Labs (all labs ordered are listed, but only abnormal results are displayed) Labs Reviewed - No data to display  EKG   Radiology No results found.  Procedures Procedures (including critical care time)  Medications Ordered in UC Medications - No data to display  Initial Impression / Assessment and Plan / UC Course  I have reviewed the triage vital signs and the nursing notes.  Pertinent labs & imaging results that were available during my care of the patient were reviewed by me and considered in my medical decision making (see chart for details).     Patient's symptoms most likely started off as a viral illness but given duration of symptoms, there is concern for secondary bacterial infection.  Therefore, will opt to treat with azithromycin given penicillin allergy.  There are no adventitious lung sounds on exam so do not think that chest imaging is necessary.  Patient was advised to follow-up if symptoms persist or worsen.  Patient verbalized understanding and was agreeable with plan. Final Clinical Impressions(s) / UC Diagnoses   Final diagnoses:  Acute upper respiratory infection     Discharge Instructions      I have prescribed an antibiotic to treat upper respiratory infection.  Please follow-up if any symptoms persist or worsen.    ED Prescriptions     Medication Sig Dispense Auth. Provider   azithromycin (ZITHROMAX Z-PAK) 250 MG tablet Take 2 tablets (500 mg total) by mouth daily for 1 day, THEN 1 tablet (250 mg total) daily for 4 days. 6 tablet [place], Michele Rockers, [place]      PDMP not reviewed this encounter.   Teodora Medici, [place] 08/11/22 1058

## 2023-02-01 LAB — HM MAMMOGRAPHY

## 2023-02-24 ENCOUNTER — Telehealth: Payer: Self-pay | Admitting: Registered Nurse

## 2023-02-24 ENCOUNTER — Telehealth: Payer: Self-pay | Admitting: Family Medicine

## 2023-02-24 ENCOUNTER — Ambulatory Visit: Payer: BC Managed Care – PPO | Admitting: Family Medicine

## 2023-02-24 NOTE — Telephone Encounter (Signed)
Called patient to reschedule missed new patient appointment, patient stated she forgot about appointment and would have to call the office back to reschedule.

## 2023-02-24 NOTE — Telephone Encounter (Signed)
error 

## 2023-12-21 ENCOUNTER — Telehealth: Payer: Self-pay | Admitting: Internal Medicine

## 2023-12-21 NOTE — Telephone Encounter (Signed)
 Katie Hendrix has agreed to take this patient on as she is a friend of [name].  [phone]. Please schedule with Katie Hendrix

## 2023-12-23 ENCOUNTER — Ambulatory Visit: Admission: EM | Admit: 2023-12-23 | Discharge: 2023-12-23 | Disposition: A | Discharge status: Home / Self Care

## 2023-12-23 ENCOUNTER — Encounter: Payer: Self-pay | Admitting: Emergency Medicine

## 2023-12-23 DIAGNOSIS — R42 Dizziness and giddiness: Secondary | ICD-10-CM

## 2023-12-23 DIAGNOSIS — E538 Deficiency of other specified B group vitamins: Secondary | ICD-10-CM | POA: Insufficient documentation

## 2023-12-23 DIAGNOSIS — R399 Unspecified symptoms and signs involving the genitourinary system: Secondary | ICD-10-CM | POA: Insufficient documentation

## 2023-12-23 LAB — POCT FASTING CBG KUC MANUAL ENTRY: POCT Glucose (KUC): 94 mg/dL (ref 70–99)

## 2023-12-23 MED ORDER — MECLIZINE HCL 25 MG PO TABS
25.0000 mg | ORAL_TABLET | Freq: Once | ORAL | Status: AC
  Administered 2023-12-23: 25 mg via ORAL

## 2023-12-23 MED ORDER — MECLIZINE HCL 25 MG PO TABS: 25.0000 mg | ORAL_TABLET | Freq: Three times a day (TID) | ORAL | 2 refills | Status: AC | PRN

## 2023-12-23 NOTE — ED Provider Notes (Signed)
 [facility]  Note:  This document was prepared using Conservation officer, historic buildings and may include unintentional dictation errors.  MRN: [id number] DOB: 15-Mar-1961  Subjective:   Katie Hendrix is a 63 y.o. female presenting for intermittent dizziness symptoms x 3 weeks.  Patient reports current dizzy episode that started a few hours ago.  Patient denies any head trauma or injury.  Patient has no history of syncope or migraine headaches.  Patient states that it feels like the room is shaking but not spinning.  Patient has not taken any medication for symptoms.  Patient states that she has used Epley maneuver at home as advised by her family for vertigo symptoms with no improvement.  No shortness of breath, chest pain, weakness, fatigue.  No current facility-administered medications for this encounter.  Current Outpatient Medications:    cyanocobalamin 1000 MCG tablet, Take 100 mcg by mouth. Take shot once a month., Disp: , Rfl:    meclizine (ANTIVERT) 25 MG tablet, Take 1 tablet (25 mg total) by mouth 3 (three) times daily as needed for dizziness., Disp: 30 tablet, Rfl: 2   atovaquone-proguanil (MALARONE) 250-100 MG TABS tablet, Take 1 tablet by mouth daily. (Patient not taking: Reported on 12/23/2023), Disp: , Rfl:    azithromycin  (ZITHROMAX ) 250 MG tablet, TAKE 2 TABLETS BY MOUTH TODAY, THEN TAKE 1 TABLET DAILY FOR 4 DAYS AS DIRECTED (Patient not taking: Reported on 12/23/2023), Disp: , Rfl:    azithromycin  (ZITHROMAX ) 500 MG tablet, TAKE 2 TABLETS BY MOUTH ONCE IF NEEDED FOR MODERATE-SEVERE TRAVELERES DIARRHEA (Patient not taking: Reported on 12/23/2023), Disp: , Rfl:    bisacodyl (DULCOLAX) 5 MG EC tablet, See admin instructions. (Patient not taking: Reported on 12/23/2023), Disp: , Rfl:    chlorhexidine (PERIDEX) 0.12 % solution, chlorhexidine gluconate 0.12 % mouthwash  RINSE 1/2 OZ TWICE A DAY AFTER BREAKFAST AND BEFORE BEDTIME (Patient not taking: Reported on 12/23/2023), Disp: , Rfl:     Cholecalciferol 1.25 MG (50000 UT) capsule, cholecalciferol (vitamin D3) 1,250 mcg (50,000 unit) capsule  TAKE 1 CAPSULE BY MOUTH WEEKLY X 8 WEEKS (Patient not taking: Reported on 12/23/2023), Disp: , Rfl:    meloxicam (MOBIC) 15 MG tablet, Take 15 mg by mouth daily. (Patient not taking: Reported on 12/23/2023), Disp: , Rfl:    polyethylene glycol-electrolytes (NULYTELY) 420 g solution, See admin instructions. (Patient not taking: Reported on 12/23/2023), Disp: , Rfl:    Allergies  Allergen Reactions   Amoxapine And Related Dermatitis and Swelling   Nitrofurantoin Other (See Comments)    Macrobid   Other Other (See Comments)   Penicillin G Other (See Comments)   Penicillins Rash and Swelling    Past Medical History:  Diagnosis Date   Anemia      Past Surgical History:  Procedure Laterality Date   ABDOMINAL HYSTERECTOMY     BREAST EXCISIONAL BIOPSY Left 1992   benign   BREAST EXCISIONAL BIOPSY Left 1980   benign   CESAREAN SECTION     OVARIAN CYST REMOVAL     TUBAL LIGATION      Family History  Problem Relation Age of Onset   Diabetes Mother    Breast cancer Mother    Diabetes Father     Social History   Tobacco Use   Smoking status: Never    Passive exposure: Never   Smokeless tobacco: Never  Vaping Use   Vaping status: Never Used  Substance Use Topics   Alcohol use: No   Drug use: No  ROS Refer to HPI for ROS details.  Objective:   Vitals: BP 126/85 (BP Location: Left Arm)   Pulse 74   Temp 98 F (36.7 C) (Oral)   Resp 18   SpO2 99%   Physical Exam Vitals and nursing note reviewed.  Constitutional:      General: She is not in acute distress.    Appearance: Normal appearance. She is well-developed. She is not ill-appearing, toxic-appearing or diaphoretic.  HENT:     Head: Normocephalic and atraumatic.     Right Ear: Tympanic membrane, ear canal and external ear normal.     Left Ear: Tympanic membrane, ear canal and external ear normal.    Eyes:     General: Vision grossly intact. Gaze aligned appropriately.        Right eye: No discharge.        Left eye: No discharge.     Extraocular Movements: Extraocular movements intact.     Right eye: No nystagmus.     Left eye: No nystagmus.     Conjunctiva/sclera: Conjunctivae normal.     Pupils: Pupils are equal, round, and reactive to light.    Cardiovascular:     Rate and Rhythm: Normal rate and regular rhythm.     Heart sounds: Normal heart sounds. No murmur heard. Pulmonary:     Effort: Pulmonary effort is normal. No respiratory distress.     Breath sounds: Normal breath sounds. No stridor. No wheezing, rhonchi or rales.   Skin:    General: Skin is warm and dry.   Neurological:     General: No focal deficit present.     Mental Status: She is alert and oriented to person, place, and time.     Cranial Nerves: No cranial nerve deficit.     Sensory: No sensory deficit.     Motor: No weakness.     Coordination: Coordination normal.     Gait: Gait normal.   Psychiatric:        Mood and Affect: Mood normal.        Behavior: Behavior normal.     Procedures  Results for orders placed or performed during the hospital encounter of 12/23/23 (from the past 24 hours)  POCT CBG (manual entry)     Status: Normal   Collection Time: 12/23/23  4:42 PM  Result Value Ref Range   POCT Glucose ([facility]) 94 70 - 99 mg/dL    No results found.   Assessment and Plan :     Discharge Instructions       1. Vertigo (Primary) - POCT CBG (manual entry) performed in UC is 94 mg/dL - ED EKG performed in UC is normal showing normal sinus rhythm with ventricular rate of 63 bpm, no STEMI.  It is presumed that dizziness and vertigo symptoms are not secondary to abnormal cardiac function at this time. - meclizine (ANTIVERT) tablet 25 mg given in UC for acute vertigo symptoms. - meclizine (ANTIVERT) 25 MG tablet; Take 1 tablet (25 mg total) by mouth 3 (three) times daily as needed for  dizziness.  Dispense: 30 tablet; Refill: 2 -Continue to monitor symptoms for any change in severity if there is any escalation of current symptoms or development of new symptoms follow-up in ER for further evaluation and management.       Emett Stapel B Lehman Whiteley   Jacques Fife, [place], [place] 12/23/23 1730

## 2023-12-23 NOTE — Discharge Instructions (Addendum)
  1. Vertigo (Primary) - POCT CBG (manual entry) performed in UC is 94 mg/dL - ED EKG performed in UC is normal showing normal sinus rhythm with ventricular rate of 63 bpm, no STEMI.  It is presumed that dizziness and vertigo symptoms are not secondary to abnormal cardiac function at this time. - meclizine (ANTIVERT) tablet 25 mg given in UC for acute vertigo symptoms. - meclizine (ANTIVERT) 25 MG tablet; Take 1 tablet (25 mg total) by mouth 3 (three) times daily as needed for dizziness.  Dispense: 30 tablet; Refill: 2 -Continue to monitor symptoms for any change in severity if there is any escalation of current symptoms or development of new symptoms follow-up in ER for further evaluation and management.

## 2023-12-23 NOTE — ED Triage Notes (Signed)
 Pt reports dizziness episodes x3 weeks. Currently dizzy. Pt is unsure of causative factor or aggravating factors. Denies syncope episodes. Pt states over the last 3 weeks she has had multiple dizziness episodes daily. Pt is wondering if she has vertigo as her mother has a hx of vertigo with similar episodes.  Pt has normal, unlabored breathing with 98-99% pulse oximetry, but states she had a breathing problem 2 weeks ago. States it has gotten better but still has a lingering dry cough she would like assessed. Pt is scheduled to set up primary care in July.

## 2024-01-12 ENCOUNTER — Ambulatory Visit: Payer: Self-pay | Admitting: Medical

## 2024-01-12 ENCOUNTER — Encounter: Payer: Self-pay | Admitting: Medical

## 2024-01-12 VITALS — BP 122/78 | HR 58 | Ht 61.75 in | Wt 146.6 lb

## 2024-01-12 DIAGNOSIS — Z8249 Family history of ischemic heart disease and other diseases of the circulatory system: Secondary | ICD-10-CM | POA: Diagnosis not present

## 2024-01-12 DIAGNOSIS — Z1389 Encounter for screening for other disorder: Secondary | ICD-10-CM | POA: Diagnosis not present

## 2024-01-12 DIAGNOSIS — Z131 Encounter for screening for diabetes mellitus: Secondary | ICD-10-CM

## 2024-01-12 DIAGNOSIS — D510 Vitamin B12 deficiency anemia due to intrinsic factor deficiency: Secondary | ICD-10-CM | POA: Diagnosis not present

## 2024-01-12 DIAGNOSIS — R5383 Other fatigue: Secondary | ICD-10-CM

## 2024-01-12 DIAGNOSIS — Z13220 Encounter for screening for lipoid disorders: Secondary | ICD-10-CM

## 2024-01-12 DIAGNOSIS — Z136 Encounter for screening for cardiovascular disorders: Secondary | ICD-10-CM

## 2024-01-12 DIAGNOSIS — R9431 Abnormal electrocardiogram [ECG] [EKG]: Secondary | ICD-10-CM

## 2024-01-12 LAB — POCT URINALYSIS DIP (PROADVANTAGE DEVICE)
Bilirubin, UA: NEGATIVE
Blood, UA: NEGATIVE
Glucose, UA: NEGATIVE mg/dL
Ketones, POC UA: NEGATIVE mg/dL
Leukocytes, UA: NEGATIVE
Nitrite, UA: NEGATIVE
Protein Ur, POC: NEGATIVE mg/dL
Specific Gravity, Urine: 1.015
Urobilinogen, Ur: NEGATIVE
pH, UA: 6 (ref 5.0–8.0)

## 2024-01-12 LAB — LIPID PANEL

## 2024-01-12 NOTE — Addendum Note (Signed)
 Addended by: VICCI HUSBAND A on: 01/12/2024 04:42 PM   Modules accepted: Orders

## 2024-01-12 NOTE — Progress Notes (Signed)
 Subjective:  Katie Hendrix is a 63 y.o. female who presents for Chief Complaint  Patient presents with   get established    New pt get Established, been a while since she has seen primary care but see obgyn. Seen at 12/22/13 for vertigo and was given medication to help with this and hasn't had a flare up again. Would like a panel of blood work done and A1c . Not as active as she was. Declines any shots today. Requested Colonoscopy, mammogram and Pap today     Here to establish care.  Referred by another patient who is already established here  Sees gynecology, Katie Monte, PA-C.  Is up-to-date on cancer screening including colonoscopy mammogram and Pap smear.  She sees Katie Hendrix for GI  She recently saw urgent care for vertigo and had a normal blood sugar test but she notes elevated sugar in the past  She would like a panel of labs to check some things out.  She wants to have her blood counts liver kidney and diabetes and cholesterol screenings.  She has a history of B12 deficiency.  She gives herself monthly injections in the thighs.  She has a history of heart catheterization several years ago maybe close to 10 years ago.  Her mother and sister have had heart disease.  No other aggravating or relieving factors.    No other c/o.  Past Medical History:  Diagnosis Date   Anemia    B12 deficiency    GERD (gastroesophageal reflux disease)    Vertigo    Current Outpatient Medications on File Prior to Visit  Medication Sig Dispense Refill   Cyanocobalamin (B-12 COMPLIANCE INJECTION IJ) Inject 1 mg as directed every 30 (thirty) days.     meclizine  (ANTIVERT ) 25 MG tablet Take 1 tablet (25 mg total) by mouth 3 (three) times daily as needed for dizziness. (Patient not taking: Reported on 01/12/2024) 30 tablet 2   No current facility-administered medications on file prior to visit.     The following portions of the patient's history were reviewed and updated as appropriate: allergies,  current medications, past family history, past medical history, past social history, past surgical history and problem list.  ROS Otherwise as in subjective above  Objective: BP 122/78   Pulse (!) 58   Ht 5' 1.75 (1.568 m)   Wt 146 lb 9.6 oz (66.5 kg)   SpO2 100%   BMI 27.03 kg/m   General appearance: alert, no distress, well developed, well nourished HEENT: normocephalic, sclerae anicteric, conjunctiva pink and moist, TMs pearly, nares patent, no discharge or erythema, pharynx normal Oral cavity: MMM, no lesions Neck: supple, no lymphadenopathy, no thyromegaly, no masses Heart: RRR, normal S1, S2, no murmurs Lungs: CTA bilaterally, no wheezes, rhonchi, or rales Pulses: 2+ radial pulses, 2+ pedal pulses, normal cap refill Ext: no edema Neuro: CN II through XII intact, nonfocal exam Psych: Pleasant, answers questions appropriately   Assessment: Encounter Diagnoses  Name Primary?   Pernicious anemia Yes   Family history of premature CAD    Encounter for lipid screening for cardiovascular disease    Screening for diabetes mellitus    Fatigue, unspecified type    Screening for heart disease    Abnormal EKG      Plan: We discussed her concerns, request for baseline labs Labs as below History of B12 deficiency, continue monthly self B12 injections We will request a copy of her cancer screening from gynecology and gastroenterology Given her strong family history  of heart disease and given her recent EKG which shows some T wave inversions compared to prior old EKG referral for updated cardiology consult Consider CT coronary test   Katie Hendrix was seen today for get established.  Diagnoses and all orders for this visit:  Pernicious anemia -     Comprehensive metabolic panel with GFR -     CBC -     Vitamin B12  Family history of premature CAD -     Lipid panel  Encounter for lipid screening for cardiovascular disease -     Comprehensive metabolic panel with GFR -      Lipid panel  Screening for diabetes mellitus -     Hemoglobin A1c  Fatigue, unspecified type -     Comprehensive metabolic panel with GFR -     TSH  Screening for heart disease -     Ambulatory referral to Cardiology  Abnormal EKG -     Ambulatory referral to Cardiology    Follow up: Pending labs, referral

## 2024-01-13 ENCOUNTER — Ambulatory Visit: Payer: Self-pay | Admitting: Medical

## 2024-01-13 LAB — CBC
Hematocrit: 41.2 % (ref 34.0–46.6)
Hemoglobin: 13.2 g/dL (ref 11.1–15.9)
MCH: 30.6 pg (ref 26.6–33.0)
MCHC: 32 g/dL (ref 31.5–35.7)
MCV: 95 fL (ref 79–97)
Platelets: 254 10*3/uL (ref 150–450)
RBC: 4.32 x10E6/uL (ref 3.77–5.28)
RDW: 12.4 % (ref 11.7–15.4)
WBC: 6.5 10*3/uL (ref 3.4–10.8)

## 2024-01-13 LAB — COMPREHENSIVE METABOLIC PANEL WITH GFR
ALT: 15 IU/L (ref 0–32)
AST: 22 IU/L (ref 0–40)
Albumin: 4.3 g/dL (ref 3.9–4.9)
Alkaline Phosphatase: 94 IU/L (ref 44–121)
BUN/Creatinine Ratio: 12 (ref 12–28)
BUN: 10 mg/dL (ref 8–27)
Bilirubin Total: 0.3 mg/dL (ref 0.0–1.2)
CO2: 21 mmol/L (ref 20–29)
Calcium: 9 mg/dL (ref 8.7–10.3)
Chloride: 105 mmol/L (ref 96–106)
Creatinine, Ser: 0.83 mg/dL (ref 0.57–1.00)
Globulin, Total: 3.1 g/dL (ref 1.5–4.5)
Glucose: 84 mg/dL (ref 70–99)
Potassium: 3.9 mmol/L (ref 3.5–5.2)
Sodium: 142 mmol/L (ref 134–144)
Total Protein: 7.4 g/dL (ref 6.0–8.5)
eGFR: 78 mL/min/{1.73_m2} (ref >59)

## 2024-01-13 LAB — HEMOGLOBIN A1C
Est. average glucose Bld gHb Est-mCnc: 117 mg/dL
Hgb A1c MFr Bld: 5.7 — AB (ref 4.8–5.6)

## 2024-01-13 LAB — VITAMIN B12: Vitamin B-12: 467 pg/mL (ref 232–1245)

## 2024-01-13 LAB — LIPID PANEL
Cholesterol, Total: 212 mg/dL — AB (ref 100–199)
HDL: 58 mg/dL (ref >39)
LDL CALC COMMENT:: 3.7 ratio (ref 0.0–4.4)
LDL Chol Calc (NIH): 142 mg/dL — AB (ref 0–99)
Triglycerides: 65 mg/dL (ref 0–149)
VLDL Cholesterol Cal: 12 mg/dL (ref 5–40)

## 2024-01-13 LAB — TSH: TSH: 2.03 u[IU]/mL (ref 0.450–4.500)

## 2024-01-13 NOTE — Telephone Encounter (Signed)
 Since you were NOT fasting, no medication change, but yes use the lifestyle, diet and exercise modifications.  We can recheck labs in 3 months fasting

## 2024-01-13 NOTE — Progress Notes (Signed)
 Results sent through MyChart

## 2024-01-13 NOTE — Progress Notes (Signed)
Abstract mammogram

## 2024-01-25 ENCOUNTER — Ambulatory Visit: Payer: Self-pay | Admitting: Medical

## 2024-01-25 NOTE — Progress Notes (Signed)
 Abstract colonoscpy

## 2024-02-02 ENCOUNTER — Other Ambulatory Visit (HOSPITAL_BASED_OUTPATIENT_CLINIC_OR_DEPARTMENT_OTHER): Payer: Self-pay | Admitting: Obstetrics and Gynecology

## 2024-02-02 DIAGNOSIS — Z13820 Encounter for screening for osteoporosis: Secondary | ICD-10-CM

## 2024-02-03 ENCOUNTER — Other Ambulatory Visit: Payer: Self-pay | Admitting: Obstetrics and Gynecology

## 2024-02-03 DIAGNOSIS — N644 Mastodynia: Secondary | ICD-10-CM

## 2024-02-03 DIAGNOSIS — Z1231 Encounter for screening mammogram for malignant neoplasm of breast: Secondary | ICD-10-CM

## 2024-02-04 ENCOUNTER — Other Ambulatory Visit: Payer: Self-pay | Admitting: Obstetrics and Gynecology

## 2024-02-04 DIAGNOSIS — N644 Mastodynia: Secondary | ICD-10-CM

## 2024-03-31 ENCOUNTER — Ambulatory Visit: Admission: EM | Admit: 2024-03-31 | Discharge: 2024-03-31 | Disposition: A | Discharge status: Home / Self Care

## 2024-03-31 ENCOUNTER — Encounter: Payer: Self-pay | Admitting: Emergency Medicine

## 2024-03-31 DIAGNOSIS — S0003XA Contusion of scalp, initial encounter: Secondary | ICD-10-CM | POA: Diagnosis not present

## 2024-03-31 MED ORDER — NAPROXEN 375 MG PO TABS: 375.0000 mg | ORAL_TABLET | Freq: Two times a day (BID) | ORAL | 0 refills | Status: AC

## 2024-03-31 NOTE — Discharge Instructions (Addendum)
 Take medication as needed for pain Go to ED if you experience vomiting, numbness or tingling, weakness, or any other worsening symtpoms

## 2024-03-31 NOTE — ED Provider Notes (Signed)
 [facility]    CSN: [id number] Arrival date & time: 03/31/24  1252      History   Chief Complaint Chief Complaint  Patient presents with   Head Injury    HPI Katie Hendrix is a 63 y.o. female.   Patient here c/w head injury x today . She stood up and hit head on corner of cabinet.  Denies bleeding, LOC, vision changes, n/t, n/v.  She is not on an anticoagulation medication.  She took 600 mg ibuprofen PTA, feels a little better.    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      Home Medications    Prior to Admission medications   Medication Sig Start Date End Date Taking? Authorizing Provider  Cyanocobalamin (B-12 COMPLIANCE INJECTION IJ) Inject 1 mg as directed every 30 (thirty) days.   Yes [provider]  naproxen  (NAPROSYN ) 375 MG tablet Take 1 tablet (375 mg total) by mouth 2 (two) times daily. 03/31/24  Yes Juleen Rush, PA-C  meclizine  (ANTIVERT ) 25 MG tablet Take 1 tablet (25 mg total) by mouth 3 (three) times daily as needed for dizziness. Patient not taking: Reported on 01/12/2024 12/23/23   Reddick, Johnathan B, NP  meloxicam (MOBIC) 15 MG tablet Take 15 mg by mouth daily. Patient not taking: Reported on 03/31/2024    [provider]    Family History Family History  Problem Relation Age of Onset    Diabetes Mother    Breast cancer Mother    Diabetes Father     Social History Social History   Tobacco Use   Smoking status: Never    Passive exposure: Never   Smokeless tobacco: Never  Vaping Use   Vaping status: Never Used  Substance Use Topics   Alcohol use: No   Drug use: No     Allergies   Amoxapine and related, Nitrofurantoin, and Penicillins   Review of Systems Review of Systems  Constitutional:  Negative for fatigue.  Eyes:  Negative for photophobia and visual disturbance.  Gastrointestinal:  Negative for nausea and vomiting.  Musculoskeletal:  Positive for myalgias. Negative for arthralgias, gait problem and joint swelling.  Skin:  Negative for color change and rash.  Neurological:  Positive for headaches. Negative for dizziness, seizures, syncope, speech difficulty, weakness, light-headedness and numbness.  Psychiatric/Behavioral:  Negative for agitation, confusion and decreased concentration.      Physical Exam Triage Vital Signs ED Triage Vitals [03/31/24 1309]  Encounter Vitals Group     BP 129/85     Girls Systolic BP Percentile      Girls Diastolic BP Percentile      Boys Systolic BP Percentile      Boys Diastolic BP Percentile  Pulse Rate 60     Resp 14     Temp 97.8 F (36.6 C)     Temp Source Oral     SpO2 98 %     Weight      Height      Head Circumference      Peak Flow      Pain Score 3     Pain Loc      Pain Education      Exclude from Growth Chart    No data found.  Updated Vital Signs BP 129/85 (BP Location: Left Arm)   Pulse 60   Temp 97.8 F (36.6 C) (Oral)   Resp 14   SpO2 98%   Visual Acuity Right Eye Distance:   Left Eye Distance:   Bilateral Distance:    Right Eye Near:   Left Eye Near:    Bilateral Near:     Physical Exam Vitals and nursing note reviewed.  Constitutional:      General: She is not in acute distress.    Appearance: Normal appearance. She is not ill-appearing.  HENT:     Head:  Normocephalic and atraumatic. No Battle's sign, abrasion, contusion or laceration.   Eyes:     General: No scleral icterus.    Extraocular Movements: Extraocular movements intact.     Conjunctiva/sclera: Conjunctivae normal.  Pulmonary:     Effort: Pulmonary effort is normal. No respiratory distress.  Musculoskeletal:        General: Normal range of motion.     Cervical back: Normal range of motion. No rigidity.  Skin:    General: Skin is warm.     Coloration: Skin is not jaundiced.     Findings: No rash.  Neurological:     General: No focal deficit present.     Mental Status: She is alert and oriented to person, place, and time.     GCS: GCS eye subscore is 4. GCS verbal subscore is 5. GCS motor subscore is 6.     Cranial Nerves: Cranial nerves 2-12 are intact.     Motor: No weakness.     Gait: Gait normal.     Deep Tendon Reflexes:     Reflex Scores:      Patellar reflexes are 2+ on the right side and 2+ on the left side. Psychiatric:        Mood and Affect: Mood normal.        Behavior: Behavior normal.      UC Treatments / Results  Labs (all labs ordered are listed, but only abnormal results are displayed) Labs Reviewed - No data to display  EKG   Radiology No results found.  Procedures Procedures (including critical care time)  Medications Ordered in UC Medications - No data to display  Initial Impression / Assessment and Plan / UC Course  I have reviewed the triage vital signs and the nursing notes.  Pertinent labs & imaging results that were available during my care of the patient were reviewed by me and considered in my medical decision making (see chart for details).     Apply ice to head 15 minutes 4 times per day Take medication as needed for pain Return as needed ED precautions proivided Final Clinical Impressions(s) / UC Diagnoses   Final diagnoses:  Contusion of scalp, initial encounter     Discharge Instructions      Take medication  as needed for pain Go to ED if you experience vomiting,  numbness or tingling, weakness, or any other worsening symtpoms    ED Prescriptions     Medication Sig Dispense Auth. Provider   naproxen  (NAPROSYN ) 375 MG tablet Take 1 tablet (375 mg total) by mouth 2 (two) times daily. 20 tablet Juleen Rush, PA-C      PDMP not reviewed this encounter.   Juleen Rush, PA-C 03/31/24 1425

## 2024-03-31 NOTE — ED Triage Notes (Signed)
 Pt reports head injury that occurred around noon. Pt bent down to grab something and as she stood up hit her head on corner of a metal box that hangs on the wall. No laceration or bleeding. Pt point to middle anterior scalp for pain site - slight swelling. Not on anticoagulants. No LOC.  600mg  ibuprofen taken on the way here - mild pain relief. Denies confusion, photosensitivity, dizziness, or lightheadedness.

## 2024-04-01 ENCOUNTER — Ambulatory Visit: Payer: Self-pay

## 2024-07-25 ENCOUNTER — Encounter: Payer: Self-pay | Admitting: Family Medicine

## 2024-07-25 ENCOUNTER — Ambulatory Visit
Admission: RE | Admit: 2024-07-25 | Discharge: 2024-07-25 | Disposition: A | Source: Ambulatory Visit | Attending: Family Medicine

## 2024-07-25 ENCOUNTER — Ambulatory Visit: Admitting: Family Medicine

## 2024-07-25 VITALS — BP 128/88 | HR 96 | Wt 136.6 lb

## 2024-07-25 DIAGNOSIS — M546 Pain in thoracic spine: Secondary | ICD-10-CM

## 2024-07-25 DIAGNOSIS — R0789 Other chest pain: Secondary | ICD-10-CM

## 2024-07-25 DIAGNOSIS — S29012A Strain of muscle and tendon of back wall of thorax, initial encounter: Secondary | ICD-10-CM

## 2024-07-25 MED ORDER — TRAMADOL HCL 50 MG PO TABS: 50.0000 mg | ORAL_TABLET | Freq: Three times a day (TID) | ORAL | 0 refills | Status: AC | PRN

## 2024-07-25 MED ORDER — MELOXICAM 15 MG PO TABS: 15.0000 mg | ORAL_TABLET | Freq: Every day | ORAL | 0 refills | Status: AC

## 2024-07-25 NOTE — Progress Notes (Signed)
 "  Name: Patriciann Becht   Date of Visit: 07/25/2024   Date of last visit with me: Visit date not found   CHIEF COMPLAINT:  Chief Complaint  Patient presents with   Acute Visit    Pain in lower back and under ribs right side.        HPI:  Discussed the use of AI scribe software for clinical note transcription with the patient, who gave verbal consent to proceed.  History of Present Illness   Renne Cornick is a 64 year old female who presents with lower back pain radiating to the ribs.  She has been experiencing sharp, stabbing pain that began in her lower back and has since moved upwards to the right side, under her ribs. The pain is accompanied by spasms described as 'somebody's grabbing me.' It worsens with movements such as twisting, turning, leaning forward, and taking deep breaths. This pain has been present for approximately three days and is more severe than when it initially started.  She has issues with her shoulder and neck, which are exacerbated by certain positions, such as sitting up or lying down. She recently participated in a CPR and first aid training course, which involved physical activity that may have contributed to her current symptoms. Additionally, she notes carrying a heavy backpack on her shoulder, which she lifts from the back seat of her car, potentially aggravating her condition.  She has stopped drinking cranberry juice due to concerns about her kidneys. No other symptoms suggestive of a urinary tract issue are present.  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 She has been cautious with her physical activities at work due to her pain.         OBJECTIVE:       01/12/2024    3:32 PM  Depression screen PHQ 2/9  Decreased Interest 0  Down, Depressed, Hopeless 0  PHQ - 2 Score 0     BP Readings from Last 3 Encounters:  07/25/24 128/88  03/31/24 129/85  01/12/24 122/78    BP 128/88   Pulse 96    Wt 136 lb 9.6 oz (62 kg)   SpO2 98%   BMI 25.19 kg/m    Physical Exam          Physical Exam Constitutional:      Appearance: Normal appearance.  Musculoskeletal:     Comments: Tenderness over the thoracic area on the right side.  Mild tenderness over the 3rd-6th ribs.  No specific point tenderness.  Difficulty with flexion extension.  Neurological:     General: No focal deficit present.     Mental Status: She is alert and oriented to person, place, and time. Mental status is at baseline.     ASSESSMENT/PLAN:   Assessment & Plan Acute right-sided thoracic back pain  Strain of thoracic spine  Rib pain    Assessment and Plan    Lumbar and thoracic back strain with spasm Acute strain likely from recent physical activity. Pain sharp, exacerbated by movement. Rib involvement less likely. - Prescribed meloxicam  daily with food for two weeks. - Prescribed tramadol  for severe pain, especially at night. - Advised heat application, avoid ice. - Ordered chest x-ray to rule out rib fracture. - Advised rest, avoid strenuous activities, especially PE. - Provided school note to excuse from PE until Monday.         Jacey Eckerson A. Vita MD [facility] "

## 2024-07-25 NOTE — Patient Instructions (Addendum)
 Please go get your xrays done at [facility]. You do not need to make an appointment. You can just show up.   Address: [address] [place], [place], [place] [address]  VISIT SUMMARY:  Today, you came in because of sharp, stabbing lower back pain that has moved up to your ribs. This pain started three days ago and has been getting worse, especially with movements like twisting, turning, and deep breaths. You mentioned that recent physical activities, such as CPR training and carrying a heavy backpack, might have contributed to your pain.  YOUR PLAN:  -LUMBAR AND THORACIC BACK STRAIN WITH SPASM: You have a muscle strain in your lower and middle back, likely caused by recent physical activities. This type of strain can cause sharp pain and muscle spasms. To help manage the pain, you have been prescribed meloxicam  to take daily with food for two weeks and tramadol  for severe pain, especially at night. You should apply heat to the affected area and avoid using ice. A chest x-ray has been ordered to rule out any rib fractures. It is important to rest and avoid strenuous activities, particularly physical education. You have been provided with a school note to excuse you from PE until Monday.  INSTRUCTIONS:  Please follow up with us  if your pain does not improve or if you experience any new symptoms. Make sure to get the chest x-ray done as soon as possible.

## 2024-08-10 ENCOUNTER — Encounter: Payer: Self-pay | Admitting: Family Medicine

## 2024-11-13 ENCOUNTER — Other Ambulatory Visit (HOSPITAL_BASED_OUTPATIENT_CLINIC_OR_DEPARTMENT_OTHER)
